# Patient Record
Sex: Male | Born: 1965 | Race: White | Hispanic: No | Marital: Married | Smoking: Current every day smoker
Health system: Southern US, Community
[De-identification: ages and names within clinical notes are randomized; demographics above are authoritative.]

## PROBLEM LIST (undated history)

## (undated) DIAGNOSIS — R51 Headache: Secondary | ICD-10-CM

## (undated) DIAGNOSIS — R519 Headache, unspecified: Secondary | ICD-10-CM

## (undated) DIAGNOSIS — E785 Hyperlipidemia, unspecified: Secondary | ICD-10-CM

## (undated) DIAGNOSIS — I10 Essential (primary) hypertension: Secondary | ICD-10-CM

## (undated) DIAGNOSIS — R569 Unspecified convulsions: Secondary | ICD-10-CM

## (undated) HISTORY — PX: POLYPECTOMY: SHX149

## (undated) HISTORY — DX: Hyperlipidemia, unspecified: E78.5

## (undated) HISTORY — DX: Unspecified convulsions: R56.9

## (undated) HISTORY — DX: Headache, unspecified: R51.9

## (undated) HISTORY — PX: COLONOSCOPY: SHX174

## (undated) HISTORY — DX: Essential (primary) hypertension: I10

## (undated) HISTORY — DX: Headache: R51

---

## 2010-07-12 HISTORY — PX: HEMORRHOID SURGERY: SHX153

## 2012-12-05 ENCOUNTER — Ambulatory Visit (INDEPENDENT_AMBULATORY_CARE_PROVIDER_SITE_OTHER): Payer: Commercial Indemnity | Admitting: Family Medicine

## 2012-12-05 ENCOUNTER — Telehealth: Payer: Self-pay | Admitting: Family Medicine

## 2012-12-05 ENCOUNTER — Encounter: Payer: Self-pay | Admitting: Family Medicine

## 2012-12-05 VITALS — BP 133/88 | HR 66 | Temp 98.0°F | Ht 69.5 in | Wt 195.0 lb

## 2012-12-05 DIAGNOSIS — K644 Residual hemorrhoidal skin tags: Secondary | ICD-10-CM

## 2012-12-05 MED ORDER — HYDROCORTISONE ACE-PRAMOXINE 1-1 % RE CREA: TOPICAL_CREAM | Freq: Two times a day (BID) | RECTAL | Status: DC

## 2012-12-05 NOTE — Progress Notes (Signed)
  Subjective:    Patient ID: Frederick Barr, male    DOB: 20-Apr-1966, 47 y.o.   MRN: [id number]  HPI Patient presents this morning with a very large right sided hemorrhoid that has been giving him trouble for about 4 days. He has had minimal pain and no bleeding. Most bothersome when he is on his feet and walking and getting up and getting down. He had previous problems in the past but that hemorrhoid resolved.  Review of Systems  Gastrointestinal: Positive for rectal pain (hemmorhoid). Negative for blood in stool and anal bleeding.       Objective:   Physical Exam  Constitutional: He is oriented to person, place, and time. He appears well-developed and well-nourished.  HENT:  Head: Normocephalic.  Eyes: Conjunctivae are normal. Right eye exhibits no discharge. Left eye exhibits no discharge.  Neck: Neck supple.  Abdominal: Soft. Bowel sounds are normal. He exhibits no distension and no mass. There is no tenderness. There is no rebound and no guarding.  No inguinal nodes. Rectal exam revealed a large protruding hemorrhoid on the right side. There was no bleeding. There was minimal pain to palpation  Musculoskeletal: Normal range of motion. He exhibits no edema.  Neurological: He is alert and oriented to person, place, and time.  Skin: Skin is warm and dry.  Psychiatric: He has a normal mood and affect. His behavior is normal. Judgment and thought content normal.          Assessment & Plan:   Large rectal hemorrhoid -ProctoCream HC 1 applicator full twice daily -Appointment with surgeon to look at I and D.     Patient Instructions  Warm tub soaks will help Continue stool softener Use medication as directed   depending on discomfort patient may remain out of work for the next 5-7 days. We will call  with appointment once it is available

## 2012-12-05 NOTE — Telephone Encounter (Signed)
PPT MADE

## 2012-12-05 NOTE — Patient Instructions (Addendum)
Warm tub soaks will help Continue stool softener Use medication as directed

## 2012-12-13 ENCOUNTER — Encounter (INDEPENDENT_AMBULATORY_CARE_PROVIDER_SITE_OTHER): Payer: Self-pay

## 2012-12-13 ENCOUNTER — Ambulatory Visit (INDEPENDENT_AMBULATORY_CARE_PROVIDER_SITE_OTHER): Payer: Managed Care, Other (non HMO) | Admitting: Surgery

## 2012-12-13 ENCOUNTER — Encounter (INDEPENDENT_AMBULATORY_CARE_PROVIDER_SITE_OTHER): Payer: Self-pay | Admitting: Surgery

## 2012-12-13 VITALS — BP 130/70 | HR 70 | Temp 98.0°F | Resp 18 | Ht 70.0 in | Wt 195.8 lb

## 2012-12-13 DIAGNOSIS — K645 Perianal venous thrombosis: Secondary | ICD-10-CM

## 2012-12-13 MED ORDER — HYDROCODONE-ACETAMINOPHEN 10-325 MG PO TABS: 1.0000 | ORAL_TABLET | Freq: Every day | ORAL | Status: AC

## 2012-12-13 NOTE — Progress Notes (Signed)
  Micharl Subramaniam 47 y.o.  Body mass index is 28.09 kg/(m^2).  There are no active problems to display for this patient.   No Known Allergies  History reviewed. No pertinent past surgical history. Rudi Heap, MD No diagnosis found.  Thrombosed hemorrhoids on the right side for several days.  Noted after mowing up hill.   After discussing with him and his wife, I prepped the area with chlorohexidine, injected with lido with epi and neut, and excised overlying skin on prolapsed internal hemorrhoids and evacuated a large number of clots.   Will give vicodin for pain.  Discussed how to avoid in the future.  Return 3 weeks Matt B. Daphine Deutscher, MD, [facility] [phone] beeper [phone]  12/13/2012 4:40 PM

## 2012-12-13 NOTE — Patient Instructions (Signed)
Hemorrhoids Hemorrhoids are swollen veins around the rectum or anus. There are two types of hemorrhoids:   Internal hemorrhoids. These occur in the veins just inside the rectum. They may poke through to the outside and become irritated and painful.  External hemorrhoids. These occur in the veins outside the anus and can be felt as a painful swelling or hard lump near the anus. CAUSES  Pregnancy.   Obesity.   Constipation or diarrhea.   Straining to have a bowel movement.   Sitting for long periods on the toilet.  Heavy lifting or other activity that caused you to strain.  Anal intercourse. SYMPTOMS   Pain.   Anal itching or irritation.   Rectal bleeding.   Fecal leakage.   Anal swelling.   One or more lumps around the anus.  DIAGNOSIS  Your caregiver may be able to diagnose hemorrhoids by visual examination. Other examinations or tests that may be performed include:   Examination of the rectal area with a gloved hand (digital rectal exam).   Examination of anal canal using a small tube (scope).   A blood test if you have lost a significant amount of blood.  A test to look inside the colon (sigmoidoscopy or colonoscopy). TREATMENT Most hemorrhoids can be treated at home. However, if symptoms do not seem to be getting better or if you have a lot of rectal bleeding, your caregiver may perform a procedure to help make the hemorrhoids get smaller or remove them completely. Possible treatments include:   Placing a rubber band at the base of the hemorrhoid to cut off the circulation (rubber band ligation).   Injecting a chemical to shrink the hemorrhoid (sclerotherapy).   Using a tool to burn the hemorrhoid (infrared light therapy).   Surgically removing the hemorrhoid (hemorrhoidectomy).   Stapling the hemorrhoid to block blood flow to the tissue (hemorrhoid stapling).  HOME CARE INSTRUCTIONS   Eat foods with fiber, such as whole grains, beans,  nuts, fruits, and vegetables. Ask your doctor about taking products with added fiber in them (fibersupplements).  Increase fluid intake. Drink enough water and fluids to keep your urine clear or pale yellow.   Exercise regularly.   Go to the bathroom when you have the urge to have a bowel movement. Do not wait.   Avoid straining to have bowel movements.   Keep the anal area dry and clean. Use wet toilet paper or moist towelettes after a bowel movement.   Medicated creams and suppositories may be used or applied as directed.   Only take over-the-counter or prescription medicines as directed by your caregiver.   Take warm sitz baths for 15 20 minutes, 3 4 times a day to ease pain and discomfort.   Place ice packs on the hemorrhoids if they are tender and swollen. Using ice packs between sitz baths may be helpful.   Put ice in a plastic bag.   Place a towel between your skin and the bag.   Leave the ice on for 15 20 minutes, 3 4 times a day.   Do not use a donut-shaped pillow or sit on the toilet for long periods. This increases blood pooling and pain.  SEEK MEDICAL CARE IF:  You have increasing pain and swelling that is not controlled by treatment or medicine.  You have uncontrolled bleeding.  You have difficulty or you are unable to have a bowel movement.  You have pain or inflammation outside the area of the hemorrhoids. MAKE SURE YOU:    Understand these instructions.  Will watch your condition.  Will get help right away if you are not doing well or get worse. Document Released: 06/25/2000 Document Revised: 06/14/2012 Document Reviewed: 05/02/2012 [employer] Patient Information 2014 [employer].  

## 2013-01-05 ENCOUNTER — Encounter (INDEPENDENT_AMBULATORY_CARE_PROVIDER_SITE_OTHER): Payer: Managed Care, Other (non HMO) | Admitting: Surgery

## 2013-02-23 ENCOUNTER — Encounter (INDEPENDENT_AMBULATORY_CARE_PROVIDER_SITE_OTHER): Payer: Managed Care, Other (non HMO) | Admitting: Surgery

## 2013-02-28 ENCOUNTER — Encounter (INDEPENDENT_AMBULATORY_CARE_PROVIDER_SITE_OTHER): Payer: Self-pay | Admitting: Surgery

## 2013-07-12 HISTORY — PX: COLONOSCOPY: SHX174

## 2013-09-11 ENCOUNTER — Encounter (INDEPENDENT_AMBULATORY_CARE_PROVIDER_SITE_OTHER): Payer: Self-pay

## 2013-09-11 ENCOUNTER — Ambulatory Visit (INDEPENDENT_AMBULATORY_CARE_PROVIDER_SITE_OTHER): Payer: Commercial Indemnity | Admitting: Family Medicine

## 2013-09-11 ENCOUNTER — Encounter: Payer: Self-pay | Admitting: Family Medicine

## 2013-09-11 VITALS — BP 149/105 | HR 76 | Temp 98.5°F | Ht 70.0 in | Wt 198.0 lb

## 2013-09-11 DIAGNOSIS — J069 Acute upper respiratory infection, unspecified: Secondary | ICD-10-CM

## 2013-09-11 DIAGNOSIS — I10 Essential (primary) hypertension: Secondary | ICD-10-CM | POA: Insufficient documentation

## 2013-09-11 DIAGNOSIS — J029 Acute pharyngitis, unspecified: Secondary | ICD-10-CM

## 2013-09-11 DIAGNOSIS — J40 Bronchitis, not specified as acute or chronic: Secondary | ICD-10-CM

## 2013-09-11 DIAGNOSIS — R6889 Other general symptoms and signs: Secondary | ICD-10-CM

## 2013-09-11 LAB — POCT INFLUENZA A/B
INFLUENZA A, POC: NEGATIVE
INFLUENZA B, POC: NEGATIVE

## 2013-09-11 LAB — POCT RAPID STREP A (OFFICE): Rapid Strep A Screen: NEGATIVE

## 2013-09-11 MED ORDER — AMLODIPINE BESY-BENAZEPRIL HCL 5-40 MG PO CAPS: 1.0000 | ORAL_CAPSULE | Freq: Every day | ORAL | Status: DC

## 2013-09-11 MED ORDER — AZITHROMYCIN 250 MG PO TABS: ORAL_TABLET | ORAL | Status: DC

## 2013-09-11 NOTE — Patient Instructions (Signed)
Take medication as directed Drink plenty of fluids Monitor blood pressure more regularly at home and bring readings for review in 4 weeks Take Mucinex maximum strength, blue and white in color, over-the-counter, one twice daily with a large glass of water Use saline nose spray Use a cool mist humidifier in her bedroom at nighttime Take Tylenol as needed for aches pains and fever

## 2013-09-11 NOTE — Progress Notes (Signed)
Subjective:    Patient ID: Frederick Barr, male    DOB: 07-09-1966, 48 y.o.   MRN: [id number]  HPI Patient here today for cough, congestion, and sore throat x 1 1/2 weeks.     There are no active problems to display for this patient.  Outpatient Encounter Prescriptions as of 09/11/2013  Medication Sig  . amLODipine-benazepril (LOTREL) 5-40 MG per capsule Take 1 capsule by mouth daily.  Marland Kitchen HYDROcodone-acetaminophen (NORCO) 10-325 MG per tablet Take 1 tablet by mouth at bedtime.  . pramoxine-hydrocortisone (PROCTOCREAM-HC) 1-1 % rectal cream Place rectally 2 (two) times daily.    Review of Systems  Constitutional: Positive for fatigue. Negative for fever and chills.  HENT: Positive for congestion (mostly in chest), postnasal drip, sinus pressure (mostly last week) and sore throat.   Eyes: Negative.   Respiratory: Positive for cough. Negative for shortness of breath.   Cardiovascular: Negative.   Gastrointestinal: Negative.   Endocrine: Negative.   Genitourinary: Negative.   Musculoskeletal: Negative.  Negative for myalgias.  Skin: Negative.   Allergic/Immunologic: Negative.   Neurological: Positive for headaches.  Hematological: Negative.   Psychiatric/Behavioral: Negative.        Objective:   Physical Exam  Nursing note and vitals reviewed. Constitutional: He is oriented to person, place, and time. He appears well-developed and well-nourished. No distress.  HENT:  Head: Normocephalic and atraumatic.  Right Ear: External ear normal.  Left Ear: External ear normal.  Nose: Nose normal.  Mouth/Throat: Oropharynx is clear and moist. No oropharyngeal exudate.  No sinus pressure tenderness  Eyes: Conjunctivae and EOM are normal. Pupils are equal, round, and reactive to light. Right eye exhibits no discharge. Left eye exhibits no discharge. No scleral icterus.  Neck: Normal range of motion. Neck supple. No tracheal deviation present. No thyromegaly present.  Cardiovascular:  Normal rate, regular rhythm, normal heart sounds and intact distal pulses.  Exam reveals no gallop and no friction rub.   No murmur heard. Irritative cough  Pulmonary/Chest: Effort normal and breath sounds normal. No respiratory distress. He has no wheezes. He has no rales. He exhibits no tenderness.  Abdominal: Soft. Bowel sounds are normal. He exhibits no mass. There is no tenderness. There is no rebound and no guarding.  Musculoskeletal: Normal range of motion.  Lymphadenopathy:    He has no cervical adenopathy.  Neurological: He is alert and oriented to person, place, and time. No cranial nerve deficit.  Skin: Skin is warm and dry. No rash noted.  Psychiatric: He has a normal mood and affect. His behavior is normal. Judgment and thought content normal.   BP 149/105  Pulse 76  Temp(Src) 98.5 F (36.9 C) (Oral)  Ht 5\' 10"  (1.778 m)  Wt 198 lb (89.812 kg)  BMI 28.41 kg/m2  Results for orders placed in visit on 09/11/13  POCT INFLUENZA A/B      Result Value Ref Range   Influenza A, POC Negative     Influenza B, POC Negative    POCT RAPID STREP A (OFFICE)      Result Value Ref Range   Rapid Strep A Screen Negative  Negative         Assessment & Plan:   1. Sore throat - POCT Influenza A/B - POCT rapid strep A  2. Flu-like symptoms - POCT Influenza A/B - POCT rapid strep A  3. Hypertension -Monitor blood pressures more regularly at home -Watch sodium intake -Bring readings for review in 4 week  4. URI (upper  respiratory infection)  5. Bronchitis  Meds ordered this encounter  Medications  . azithromycin (ZITHROMAX) 250 MG tablet    Sig: As directed    Dispense:  6 tablet    Refill:  0  . amLODipine-benazepril (LOTREL) 5-40 MG per capsule    Sig: Take 1 capsule by mouth daily.    Dispense:  30 capsule    Refill:  6   Patient Instructions  Take medication as directed Drink plenty of fluids Monitor blood pressure more regularly at home and bring readings for  review in 4 weeks Take Mucinex maximum strength, blue and white in color, over-the-counter, one twice daily with a large glass of water Use saline nose spray Use a cool mist humidifier in her bedroom at nighttime Take Tylenol as needed for aches pains and fever   Arrie Senate MD

## 2013-11-12 ENCOUNTER — Telehealth: Payer: Self-pay | Admitting: Family Medicine

## 2013-11-15 ENCOUNTER — Other Ambulatory Visit: Payer: Self-pay | Admitting: *Deleted

## 2013-11-15 MED ORDER — AMLODIPINE BESY-BENAZEPRIL HCL 5-40 MG PO CAPS: 1.0000 | ORAL_CAPSULE | Freq: Every day | ORAL | Status: DC

## 2013-11-16 ENCOUNTER — Other Ambulatory Visit: Payer: Self-pay

## 2013-11-16 MED ORDER — AMLODIPINE BESY-BENAZEPRIL HCL 5-40 MG PO CAPS: 1.0000 | ORAL_CAPSULE | Freq: Every day | ORAL | Status: DC

## 2014-02-21 ENCOUNTER — Other Ambulatory Visit: Payer: Self-pay | Admitting: Family Medicine

## 2014-02-22 NOTE — Telephone Encounter (Signed)
Last seen 3/15  DWM  Requesting 90 day supply

## 2014-03-06 ENCOUNTER — Encounter: Payer: Self-pay | Admitting: Internal Medicine

## 2014-03-06 ENCOUNTER — Ambulatory Visit (INDEPENDENT_AMBULATORY_CARE_PROVIDER_SITE_OTHER): Payer: Commercial Indemnity | Admitting: Family Medicine

## 2014-03-06 ENCOUNTER — Encounter: Payer: Self-pay | Admitting: Family Medicine

## 2014-03-06 ENCOUNTER — Ambulatory Visit (INDEPENDENT_AMBULATORY_CARE_PROVIDER_SITE_OTHER): Payer: Commercial Indemnity

## 2014-03-06 ENCOUNTER — Encounter (INDEPENDENT_AMBULATORY_CARE_PROVIDER_SITE_OTHER): Payer: Self-pay

## 2014-03-06 VITALS — BP 150/100 | HR 64 | Temp 98.7°F | Ht 70.0 in | Wt 200.0 lb

## 2014-03-06 DIAGNOSIS — I10 Essential (primary) hypertension: Secondary | ICD-10-CM

## 2014-03-06 DIAGNOSIS — N400 Enlarged prostate without lower urinary tract symptoms: Secondary | ICD-10-CM | POA: Insufficient documentation

## 2014-03-06 DIAGNOSIS — E785 Hyperlipidemia, unspecified: Secondary | ICD-10-CM | POA: Insufficient documentation

## 2014-03-06 DIAGNOSIS — Z8249 Family history of ischemic heart disease and other diseases of the circulatory system: Secondary | ICD-10-CM | POA: Insufficient documentation

## 2014-03-06 DIAGNOSIS — Z0000 Encounter for general adult medical examination without abnormal findings: Secondary | ICD-10-CM

## 2014-03-06 DIAGNOSIS — Z1211 Encounter for screening for malignant neoplasm of colon: Secondary | ICD-10-CM

## 2014-03-06 DIAGNOSIS — M67431 Ganglion, right wrist: Secondary | ICD-10-CM

## 2014-03-06 LAB — POCT URINALYSIS DIPSTICK
BILIRUBIN UA: NEGATIVE
Blood, UA: NEGATIVE
Glucose, UA: NEGATIVE
Ketones, UA: NEGATIVE
LEUKOCYTES UA: NEGATIVE
NITRITE UA: NEGATIVE
PH UA: 5
Spec Grav, UA: <=1.005
Urobilinogen, UA: NEGATIVE

## 2014-03-06 LAB — POCT CBC
Granulocyte percent: 60.9 %G (ref 37–80)
HCT, POC: 46.3 % (ref 43.5–53.7)
Hemoglobin: 14.6 g/dL (ref 14.1–18.1)
LYMPH, POC: 2 (ref 0.6–3.4)
MCH, POC: 26.5 pg — AB (ref 27–31.2)
MCHC: 31.5 g/dL — AB (ref 31.8–35.4)
MCV: 84.1 fL (ref 80–97)
MPV: 7.4 fL (ref 0–99.8)
PLATELET COUNT, POC: 250 10*3/uL (ref 142–424)
POC GRANULOCYTE: 3.5 (ref 2–6.9)
POC LYMPH %: 35 % (ref 10–50)
RBC: 5.5 M/uL (ref 4.69–6.13)
RDW, POC: 13.5 %
WBC: 5.7 10*3/uL (ref 4.6–10.2)

## 2014-03-06 LAB — POCT UA - MICROSCOPIC ONLY
Casts, Ur, LPF, POC: NEGATIVE
Crystals, Ur, HPF, POC: NEGATIVE
Epithelial cells, urine per micros: NEGATIVE
Mucus, UA: NEGATIVE
RBC, urine, microscopic: NEGATIVE
WBC, UR, HPF, POC: NEGATIVE
YEAST UA: NEGATIVE

## 2014-03-06 MED ORDER — AMLODIPINE BESY-BENAZEPRIL HCL 5-40 MG PO CAPS: ORAL_CAPSULE | ORAL | Status: DC

## 2014-03-06 NOTE — Progress Notes (Signed)
Subjective:    Patient ID: Frederick Barr, male    DOB: [birth year], 48 y.o.   MRN: [id number]  HPI Patient is here today for annual wellness exam and follow up of chronic medical problems. The patient indicates he has been out of blood pressure medications for one week. He complains of a cyst on his right wrist. His BMI is 28. We will arrange for a colonoscopy as he is 48 years old. We will also encourage him to get a stress test because of his family history of stroke and his personal history of hypertension. The patient indicates he has a very stressful job being a Librarian, academic. His family history was reviewed with him today and there is a family history of stroke and heart disease. The patient admits to smoking 2 or 3 cigarettes daily. He indicates he wants to stop. He was instructed to call the office any time he is about to run out of his blood pressure medications so that we can refill it for him.        Patient Active Problem List   Diagnosis Date Noted  . Hypertension 09/11/2013   Outpatient Encounter Prescriptions as of 03/06/2014  Medication Sig  . amLODipine-benazepril (LOTREL) 5-40 MG per capsule TAKE 1 CAPSULE BY MOUTH DAILY.  . [DISCONTINUED] azithromycin (ZITHROMAX) 250 MG tablet As directed  . [DISCONTINUED] pramoxine-hydrocortisone (PROCTOCREAM-HC) 1-1 % rectal cream Place rectally 2 (two) times daily.    Review of Systems  Constitutional: Negative.   HENT: Negative.   Eyes: Negative.   Respiratory: Negative.   Cardiovascular: Negative.   Gastrointestinal: Negative.   Endocrine: Negative.   Genitourinary: Negative.   Musculoskeletal: Negative.   Skin: Negative.        Cyst on right wrist  Allergic/Immunologic: Negative.   Neurological: Negative.   Hematological: Negative.   Psychiatric/Behavioral: Negative.        Objective:   Physical Exam  Nursing note and vitals reviewed. Constitutional: He is oriented to person, place, and time. He appears  well-developed and well-nourished. No distress.  Positive and alert  HENT:  Head: Normocephalic and atraumatic.  Right Ear: External ear normal.  Left Ear: External ear normal.  Nose: Nose normal.  Mouth/Throat: Oropharynx is clear and moist. No oropharyngeal exudate.  Eyes: Conjunctivae and EOM are normal. Pupils are equal, round, and reactive to light. Right eye exhibits no discharge. Left eye exhibits no discharge. No scleral icterus.  Neck: Normal range of motion. Neck supple. No thyromegaly present.  No thyromegaly or carotid bruits  Cardiovascular: Normal rate, regular rhythm, normal heart sounds and intact distal pulses.  Exam reveals no gallop and no friction rub.   No murmur heard. The heart has a regular rate and rhythm at 72 per min  Pulmonary/Chest: Effort normal and breath sounds normal. No respiratory distress. He has no wheezes. He has no rales. He exhibits no tenderness.  Axillary regions are negative for adenopathy  Abdominal: Soft. Bowel sounds are normal. He exhibits no mass. There is no tenderness. There is no rebound and no guarding.  No abdominal masses or tenderness. There are no inguinal nodes present.  Genitourinary: Rectum normal, prostate normal and penis normal.  The rectal exam had a slightly enlarged prostate with no lumps or masses. There were no rectal masses. There were no inguinal hernias or inguinal nodes palpable. The external genitalia were normal.  Musculoskeletal: Normal range of motion. He exhibits no edema and no tenderness.  Patient does have a ganglion cyst in the  right volar area of the wrist, the patient indicates this has become more painful and more sensitive to his physical activity.  Lymphadenopathy:    He has no cervical adenopathy.  Neurological: He is alert and oriented to person, place, and time. He has normal reflexes. No cranial nerve deficit.  Skin: Skin is warm and dry. No rash noted. No erythema. No pallor.  Psychiatric: He has a  normal mood and affect. His behavior is normal. Judgment and thought content normal.   BP 161/96  Pulse 64  Temp(Src) 98.7 F (37.1 C) (Oral)  Ht _0  (1.778 m)  Wt 200 lb (90.719 kg)  BMI 28.70 kg/m2  EKG: Sinus bradycardia otherwise normal EKG  [facility] reading (PRIMARY) by  Dr. Brunilda Payor x-ray-  no active disease                                       Assessment & Plan:  1. Essential hypertension - POCT CBC - BMP8+EGFR - Hepatic function panel - NMR, lipoprofile - EKG 12-Lead - DG Chest 2 View; Future - Ambulatory referral to Cardiology  2. Special screening for malignant neoplasms, colon - Ambulatory referral to Gastroenterology - POCT CBC  3. Annual physical exam - POCT CBC - POCT UA - Microscopic Only - POCT urinalysis dipstick - BMP8+EGFR - Hepatic function panel - NMR, lipoprofile - PSA, total and free - Vit D  25 hydroxy (rtn osteoporosis monitoring) - Thyroid Panel With TSH - EKG 12-Lead - DG Chest 2 View; Future  4. Hyperlipidemia  5. Family history of heart disease - Ambulatory referral to Cardiology  6. Ganglion cyst of wrist, right - Ambulatory referral to Orthopedic Surgery  7. BPH (benign prostatic hyperplasia)  Meds ordered this encounter  Medications  . amLODipine-benazepril (LOTREL) 5-40 MG per capsule    Sig: TAKE 1 CAPSULE BY MOUTH DAILY.    Dispense:  30 capsule    Refill:  1   Patient Instructions  Continue current medications. Continue good therapeutic lifestyle changes which include good diet and exercise. Fall precautions discussed with patient. If an FOBT was given today- please return it to our front desk. If you are over 99 years old - you may need Prevnar 30 or the adult Pneumonia vaccine.  Flu Shots will be available at our office starting mid- September. Please call and schedule a FLU CLINIC APPOINTMENT.   Keep record of BP- readings for 4 weeks (back on meds) and fax readings to nurse [phone]. We will arrange a  visit with GI- for colonoscopy, stress test, and ortho.  Check with insurance on cost for shingles shot (ZOSTAVAX)     Arrie Senate MD

## 2014-03-06 NOTE — Patient Instructions (Addendum)
Continue current medications. Continue good therapeutic lifestyle changes which include good diet and exercise. Fall precautions discussed with patient. If an FOBT was given today- please return it to our front desk. If you are over 50 years old - you may need Prevnar 5 or the adult Pneumonia vaccine.  Flu Shots will be available at our office starting mid- September. Please call and schedule a FLU CLINIC APPOINTMENT.   Keep record of BP- readings for 4 weeks (back on meds) and fax readings to nurse [phone]. We will arrange a visit with GI- for colonoscopy, stress test, and ortho.  Check with insurance on cost for shingles shot (ZOSTAVAX)

## 2014-03-07 ENCOUNTER — Telehealth: Payer: Self-pay

## 2014-03-07 LAB — NMR, LIPOPROFILE
Cholesterol: 209 mg/dL — ABNORMAL HIGH (ref 100–199)
HDL Cholesterol by NMR: 39 mg/dL — ABNORMAL LOW (ref >39)
HDL Particle Number: 30.4 umol/L — ABNORMAL LOW (ref >=30.5)
LDL Particle Number: 1710 nmol/L — ABNORMAL HIGH (ref <1000)
LDL Size: 20.2 nm (ref >20.5)
LDLC SERPL CALC-MCNC: 142 mg/dL — ABNORMAL HIGH (ref 0–99)
LP-IR Score: 56 — ABNORMAL HIGH (ref <=45)
Small LDL Particle Number: 1099 nmol/L — ABNORMAL HIGH (ref <=527)
Triglycerides by NMR: 138 mg/dL (ref 0–149)

## 2014-03-07 LAB — BMP8+EGFR
BUN/Creatinine Ratio: 9 (ref 9–20)
BUN: 9 mg/dL (ref 6–24)
CO2: 24 mmol/L (ref 18–29)
Calcium: 9.6 mg/dL (ref 8.7–10.2)
Chloride: 102 mmol/L (ref 97–108)
Creatinine, Ser: 1.01 mg/dL (ref 0.76–1.27)
GFR calc Af Amer: 100 mL/min/{1.73_m2} (ref >59)
GFR calc non Af Amer: 86 mL/min/{1.73_m2} (ref >59)
Glucose: 95 mg/dL (ref 65–99)
Potassium: 4.8 mmol/L (ref 3.5–5.2)
SODIUM: 143 mmol/L (ref 134–144)

## 2014-03-07 LAB — THYROID PANEL WITH TSH
Free Thyroxine Index: 2.6 (ref 1.2–4.9)
T3 Uptake Ratio: 28 % (ref 24–39)
T4 TOTAL: 9.2 ug/dL (ref 4.5–12.0)
TSH: 0.967 u[IU]/mL (ref 0.450–4.500)

## 2014-03-07 LAB — HEPATIC FUNCTION PANEL
ALT: 23 IU/L (ref 0–44)
AST: 21 IU/L (ref 0–40)
Albumin: 4.8 g/dL (ref 3.5–5.5)
Alkaline Phosphatase: 69 IU/L (ref 39–117)
Bilirubin, Direct: 0.1 mg/dL (ref 0.00–0.40)
Total Bilirubin: 0.5 mg/dL (ref 0.0–1.2)
Total Protein: 6.6 g/dL (ref 6.0–8.5)

## 2014-03-07 LAB — PSA, TOTAL AND FREE
PSA FREE: 0.29 ng/mL
PSA, Free Pct: 41.4 %
PSA: 0.7 ng/mL (ref 0.0–4.0)

## 2014-03-07 LAB — VITAMIN D 25 HYDROXY (VIT D DEFICIENCY, FRACTURES): VIT D 25 HYDROXY: 28.6 ng/mL — AB (ref 30.0–100.0)

## 2014-03-07 MED ORDER — VITAMIN D (ERGOCALCIFEROL) 1.25 MG (50000 UNIT) PO CAPS: 50000.0000 [IU] | ORAL_CAPSULE | ORAL | Status: DC

## 2014-03-07 NOTE — Telephone Encounter (Signed)
Wife aware of CXR results

## 2014-03-07 NOTE — Telephone Encounter (Signed)
Message copied by Koren Bound on Thu Mar 07, 2014 12:55 PM ------      Message from: Chipper Herb      Created: Wed Mar 06, 2014  1:07 PM       As per radiology report ------

## 2014-03-07 NOTE — Addendum Note (Signed)
Addended by: Zannie Cove on: 03/07/2014 09:14 AM   Modules accepted: Orders

## 2014-03-07 NOTE — Telephone Encounter (Signed)
Pt's wife aware of results

## 2014-03-12 ENCOUNTER — Telehealth: Payer: Self-pay

## 2014-03-12 ENCOUNTER — Other Ambulatory Visit: Payer: Self-pay | Admitting: *Deleted

## 2014-03-12 DIAGNOSIS — E785 Hyperlipidemia, unspecified: Secondary | ICD-10-CM

## 2014-03-12 DIAGNOSIS — I10 Essential (primary) hypertension: Secondary | ICD-10-CM

## 2014-03-12 DIAGNOSIS — Z8249 Family history of ischemic heart disease and other diseases of the circulatory system: Secondary | ICD-10-CM

## 2014-03-12 NOTE — Telephone Encounter (Signed)
You referred me to Dr Daryll Brod  I am not satisfied and want to be referred to someone more reputable

## 2014-03-12 NOTE — Telephone Encounter (Signed)
This is the physician that I have seen multiple times. I was seen anybody in my family to see him because I think he is very good. Please let me know you would like to see them we'll we'll be happy to make the referral to that  orthopedic surgeon

## 2014-03-13 ENCOUNTER — Telehealth: Payer: Self-pay | Admitting: Cardiology

## 2014-03-13 NOTE — Telephone Encounter (Signed)
Spoke with patient.

## 2014-03-13 NOTE — Telephone Encounter (Signed)
New message ° ° ° ° ° ° °Pt returning nurse call  °

## 2014-03-15 ENCOUNTER — Ambulatory Visit (INDEPENDENT_AMBULATORY_CARE_PROVIDER_SITE_OTHER): Payer: Managed Care, Other (non HMO) | Admitting: Physician Assistant

## 2014-03-15 DIAGNOSIS — E785 Hyperlipidemia, unspecified: Secondary | ICD-10-CM

## 2014-03-15 DIAGNOSIS — I10 Essential (primary) hypertension: Secondary | ICD-10-CM

## 2014-03-15 DIAGNOSIS — Z8249 Family history of ischemic heart disease and other diseases of the circulatory system: Secondary | ICD-10-CM

## 2014-03-15 NOTE — Progress Notes (Signed)
Exercise Treadmill Test  Frederick Barr is a 48 y.o. male smoker with a hx of HTN and FHx of CAD referred by PCP for ETT due to risk factors.  No hx of DM.  No hx of chest pain, dyspnea, syncope.  Exam unremarkable.  ECG: NSR no ST changes  Pre-Exercise Testing Evaluation Rhythm: normal sinus  Rate: 68     Test  Exercise Tolerance Test Ordering MD: Dola Argyle, MD  Interpreting MD: Richardson Dopp, PA-C  Unique Test No: 1  Treadmill:  1  Indication for ETT: Family History  Contraindication to ETT: No   Stress Modality: exercise - treadmill  Cardiac Imaging Performed: non   Protocol: standard Bruce - maximal  Max BP:  207/86  Max MPHR (bpm):  170 85% MPR (bpm):  145  MPHR obtained (bpm):  166 % MPHR obtained:  97  Reached 85% MPHR (min:sec):  8:48 Total Exercise Time (min-sec):  10:00  Workload in METS:  11.8 Borg Scale: 19  Reason ETT Terminated:  patient's desire to stop    ST Segment Analysis At Rest: normal ST segments - no evidence of significant ST depression With Exercise: non-specific ST changes  Other Information Arrhythmia:  No Angina during ETT:  absent (0) Quality of ETT:  diagnostic  ETT Interpretation:  normal - no evidence of ischemia by ST analysis  Comments: Good exercise capacity. No chest pain. Normal BP response to exercise. No significant ST changes to suggest ischemia.   Recommendations: FU with Redge Gainer, MD as directed. He has an appointment with Dr. Loralie Champagne as a new patient soon as well.  Signed,  Richardson Dopp, PA-C   03/15/2014 11:15 AM

## 2014-03-28 ENCOUNTER — Encounter: Payer: Self-pay | Admitting: *Deleted

## 2014-03-29 ENCOUNTER — Ambulatory Visit (INDEPENDENT_AMBULATORY_CARE_PROVIDER_SITE_OTHER): Payer: Managed Care, Other (non HMO) | Admitting: Cardiology

## 2014-03-29 ENCOUNTER — Encounter: Payer: Self-pay | Admitting: Cardiology

## 2014-03-29 VITALS — BP 168/108 | HR 73 | Ht 70.0 in | Wt 220.0 lb

## 2014-03-29 DIAGNOSIS — Z8249 Family history of ischemic heart disease and other diseases of the circulatory system: Secondary | ICD-10-CM

## 2014-03-29 DIAGNOSIS — I10 Essential (primary) hypertension: Secondary | ICD-10-CM

## 2014-03-29 DIAGNOSIS — E785 Hyperlipidemia, unspecified: Secondary | ICD-10-CM

## 2014-03-29 NOTE — Patient Instructions (Signed)
Your physician wants you to follow-up in: 1 year with Dr McLean. (September 2016).  You will receive a reminder letter in the mail two months in advance. If you don't receive a letter, please call our office to schedule the follow-up appointment.  

## 2014-03-31 NOTE — Progress Notes (Signed)
Patient ID: Frederick Barr, male   DOB: 1965/09/19, 48 y.o.   MRN: [id number] PCP: Dr. Laurance Flatten  48 yo with history of HTN, hyperlipidemia, and FH of CAD presents for cardiology evaluation.  He is an active smoker, 2-3 cigs/day.  His BP is very high today but he did not take his medication this morning and is also upset because he was "lost" in the waiting room by the front desk staff this morning.  He has good exercise tolerance.  No chest pain or exertional dyspnea.  Not a lot of formal exercise but walks a lot at work.  No tachypalpitations or lightheadedness.  Tolerates his BP med without problems. He says that BP generally runs 140s/80s after taking his medication.  He was here earlier this month for a stress test.  This was normal with 10 minutes exercise and no ischemic changes on ECG.  Also of note, LDL and LDL particle number were high on recent lipid panel.   Labs (8/15): K 4.8, creatinine 1.01, LDL particle number 1710, LDL 142  PMH: 1. HTN 2. Hyperlipidemia 3. Active smoker 4. ETT (9/15) with 10' exercise, no ischemic ECG changes.   SH: Teacher, English as a foreign language for [employer], lives in [place], smokes 2-3 cigs/day  FH: Mother with CABG in late 31s, mother with CVA, father with CVA  ROS: All systems reviewed and negative except as per HPI.   Current Outpatient Prescriptions  Medication Sig Dispense Refill  . amLODipine-benazepril (LOTREL) 5-40 MG per capsule TAKE 1 CAPSULE BY MOUTH DAILY.  30 capsule  1  . Vitamin D, Ergocalciferol, (DRISDOL) 50000 UNITS CAPS capsule Take 1 capsule (50,000 Units total) by mouth every 7 (seven) days.  12 capsule  1   No current facility-administered medications for this visit.   BP 168/108  Pulse 73  Ht 5\' 10"  (1.778 m)  Wt 220 lb (99.791 kg)  BMI 31.57 kg/m2  SpO2 98% General: NAD Neck: No JVD, no thyromegaly or thyroid nodule.  Lungs: Clear to auscultation bilaterally with normal respiratory effort. CV: Nondisplaced PMI.  Heart regular S1/S2, no  S3/S4, no murmur.  No peripheral edema.  No carotid bruit.  Normal pedal pulses.  Abdomen: Soft, nontender, no hepatosplenomegaly, no distention.  Skin: Intact without lesions or rashes.  Neurologic: Alert and oriented x 3.  Psych: Normal affect. Extremities: No clubbing or cyanosis.  HEENT: Normal.   Assessment/Plan: 1. CAD risk: Multiple risk factors including family history, smoking, HTN, hyperlipidemia.  It is imperative that he quit smoking.  We discussed this today.  He is also going to start on ASA 81 mg daily as he is now 48 yrs old. He had a reassuring treadmill stress test earlier this month and has no ischemic symptoms.  2. Hyperlipidemia: LDL is moderately elevated, but also significantly his LDL particle number is high.  This is a relatively high risk profile.  Given his overall risk profile, I think that he should begin on a statin. We discussed this today.  He has a followup visit to go over his lipids at Dr [facility] office and hopefully will initiate a statin at that time.  3. HTN: BP quite high today. He did not take his BP med this morning.  He says it runs 140s/80s when he is taking it.  Give most recent trial data, would like to see SBP < 130 if possible.  We talked about strategies for doing this.  He wants to work on losing 15-20 lbs, which he has done in  the past with fall in BP.  Would give him 6-8 months but if BP remains high at that time will need additional medication.   Loralie Champagne 03/31/2014

## 2014-04-26 ENCOUNTER — Ambulatory Visit (AMBULATORY_SURGERY_CENTER): Payer: Self-pay | Admitting: *Deleted

## 2014-04-26 VITALS — Ht 70.0 in | Wt 202.6 lb

## 2014-04-26 DIAGNOSIS — Z1211 Encounter for screening for malignant neoplasm of colon: Secondary | ICD-10-CM

## 2014-04-26 MED ORDER — MOVIPREP 100 G PO SOLR: 1.0000 | Freq: Once | ORAL | Status: DC

## 2014-04-26 NOTE — Progress Notes (Signed)
Denies allergies to eggs or soy products. Denies complications with sedation or anesthesia. Denies O2 use. Denies use of diet or weight loss medications.  Emmi instructions given for colonoscopy.  

## 2014-05-06 ENCOUNTER — Encounter: Payer: Self-pay | Admitting: Internal Medicine

## 2014-05-10 ENCOUNTER — Encounter: Payer: Self-pay | Admitting: Internal Medicine

## 2014-05-10 ENCOUNTER — Ambulatory Visit (AMBULATORY_SURGERY_CENTER): Payer: Managed Care, Other (non HMO) | Admitting: Internal Medicine

## 2014-05-10 VITALS — BP 123/88 | HR 60 | Temp 96.7°F | Resp 26 | Ht 70.0 in | Wt 202.0 lb

## 2014-05-10 DIAGNOSIS — D124 Benign neoplasm of descending colon: Secondary | ICD-10-CM

## 2014-05-10 DIAGNOSIS — D123 Benign neoplasm of transverse colon: Secondary | ICD-10-CM

## 2014-05-10 DIAGNOSIS — Z1211 Encounter for screening for malignant neoplasm of colon: Secondary | ICD-10-CM

## 2014-05-10 MED ORDER — SODIUM CHLORIDE 0.9 % IV SOLN: 500.0000 mL | INTRAVENOUS | Status: DC

## 2014-05-10 NOTE — Op Note (Signed)
[place]  [address] [place], [address]   COLONOSCOPY PROCEDURE REPORT  PATIENT: Frederick Barr, Frederick Barr  MR#: [id number] BIRTHDATE: 08-17-1965 , 48  yrs. old GENDER: male ENDOSCOPIST: Jerene Bears, MD REFERRED YT:[id number] Laurance Flatten, M.D. PROCEDURE DATE:  05/10/2014 PROCEDURE:   Colonoscopy with snare polypectomy and Colonoscopy with cold biopsy polypectomy First Screening Colonoscopy - Avg.  risk and is 50 yrs.  old or older Yes.  Prior Negative Screening - Now for repeat screening. N/A  History of Adenoma - Now for follow-up colonoscopy & has been > or = to 3 yrs.  N/A  Polyps Removed Today? Yes. ASA CLASS:   Class II INDICATIONS:average risk for colorectal cancer and first colonoscopy. MEDICATIONS: Monitored anesthesia care and Propofol 200 mg IV  DESCRIPTION OF PROCEDURE:   After the risks benefits and alternatives of the procedure were thoroughly explained, informed consent was obtained.  The digital rectal exam revealed no rectal mass.   The LB [id number]  endoscope was introduced through the anus and advanced to the cecum, which was identified by both the appendix and ileocecal valve. No adverse events experienced. The quality of the prep was good, using MoviPrep  The instrument was then slowly withdrawn as the colon was fully examined.   COLON FINDINGS: Five sessile polyps ranging between 3-66mm in size were found in the descending colon (1) and transverse colon (4). Polypectomies were performed with a cold snare (3) and with cold forceps (2).  The resection was complete, the polyp tissue was completely retrieved and sent to histology.   The examination was otherwise normal.  Retroflexed views revealed no abnormalities. The time to cecum=1 minutes 20 seconds.  Withdrawal time=9 minutes 58 seconds.  The scope was withdrawn and the procedure completed. COMPLICATIONS: There were no immediate complications.  ENDOSCOPIC IMPRESSION: 1.   Five sessile polyps  ranging between 3-56mm in size were found in the descending colon and transverse colon; polypectomies were performed with a cold snare and with cold forceps 2.   The examination was otherwise normal  RECOMMENDATIONS: 1.  Await pathology results 2.  If the polyps removed today are proven to be adenomatous (pre-cancerous) polyps, you will need a colonoscopy in 3 years. Otherwise you should continue to follow colorectal cancer screening guidelines for "routine risk" patients with a colonoscopy in 10 years.  You will receive a letter within 1-2 weeks with the results of your biopsy as well as final recommendations.  Please call my office if you have not received a letter after 3 weeks.  eSigned:  Jerene Bears, MD 05/10/2014 8:21 AM  cc: Redge Gainer, MD and The Patient

## 2014-05-10 NOTE — Progress Notes (Signed)
Patient awakening,vss,report to rn 

## 2014-05-10 NOTE — Progress Notes (Signed)
Called to room to assist during endoscopic procedure.  Patient ID and intended procedure confirmed with present staff. Received instructions for my participation in the procedure from the performing physician.  

## 2014-05-10 NOTE — Patient Instructions (Signed)
YOU HAD AN ENDOSCOPIC PROCEDURE TODAY AT THE [facility]: Refer to the procedure report that was given to you for any specific questions about what was found during the examination.  If the procedure report does not answer your questions, please call your gastroenterologist to clarify.  If you requested that your care partner not be given the details of your procedure findings, then the procedure report has been included in a sealed envelope for you to review at your convenience later.  YOU SHOULD EXPECT: Some feelings of bloating in the abdomen. Passage of more gas than usual.  Walking can help get rid of the air that was put into your GI tract during the procedure and reduce the bloating. If you had a lower endoscopy (such as a colonoscopy or flexible sigmoidoscopy) you may notice spotting of blood in your stool or on the toilet paper. If you underwent a bowel prep for your procedure, then you may not have a normal bowel movement for a few days.  DIET: Your first meal following the procedure should be a light meal and then it is ok to progress to your normal diet.  A half-sandwich or bowl of soup is an example of a good first meal.  Heavy or fried foods are harder to digest and may make you feel nauseous or bloated.  Likewise meals heavy in dairy and vegetables can cause extra gas to form and this can also increase the bloating.  Drink plenty of fluids but you should avoid alcoholic beverages for 24 hours.  ACTIVITY: Your care partner should take you home directly after the procedure.  You should plan to take it easy, moving slowly for the rest of the day.  You can resume normal activity the day after the procedure however you should NOT DRIVE or use heavy machinery for 24 hours (because of the sedation medicines used during the test).    SYMPTOMS TO REPORT IMMEDIATELY: A gastroenterologist can be reached at any hour.  During normal business hours, 8:30 AM to 5:00 PM Monday through Friday,  call [phone].  After hours and on weekends, please call the GI answering service at [phone] who will take a message and have the physician on call contact you.   Following lower endoscopy (colonoscopy or flexible sigmoidoscopy):  Excessive amounts of blood in the stool  Significant tenderness or worsening of abdominal pains  Swelling of the abdomen that is new, acute  Fever of 100F or higher    FOLLOW UP: If any biopsies were taken you will be contacted by phone or by letter within the next 1-3 weeks.  Call your gastroenterologist if you have not heard about the biopsies in 3 weeks.  Our staff will call the home number listed on your records the next business day following your procedure to check on you and address any questions or concerns that you may have at that time regarding the information given to you following your procedure. This is a courtesy call and so if there is no answer at the home number and we have not heard from you through the emergency physician on call, we will assume that you have returned to your regular daily activities without incident.  SIGNATURES/CONFIDENTIALITY: You and/or your care partner have signed paperwork which will be entered into your electronic medical record.  These signatures attest to the fact that that the information above on your After Visit Summary has been reviewed and is understood.  Full responsibility of the confidentiality   of this discharge information lies with you and/or your care-partner.    Information on polyps given to you today 

## 2014-05-13 ENCOUNTER — Telehealth: Payer: Self-pay

## 2014-05-13 NOTE — Telephone Encounter (Signed)
Left message on answering machine. 

## 2014-05-15 ENCOUNTER — Encounter: Payer: Self-pay | Admitting: Internal Medicine

## 2014-05-30 ENCOUNTER — Other Ambulatory Visit: Payer: Self-pay | Admitting: Family Medicine

## 2014-05-30 ENCOUNTER — Other Ambulatory Visit: Payer: Self-pay | Admitting: *Deleted

## 2014-05-30 DIAGNOSIS — I10 Essential (primary) hypertension: Secondary | ICD-10-CM

## 2014-05-30 MED ORDER — AMLODIPINE BESY-BENAZEPRIL HCL 5-40 MG PO CAPS
ORAL_CAPSULE | ORAL | Status: DC
Start: 2014-05-30 — End: 2014-05-31

## 2014-05-30 NOTE — Telephone Encounter (Signed)
Pt needs refill on Lotrel until his appt in Feb, lotrel rx sent to the [facility] in [place], pt aware will close call.

## 2014-05-31 ENCOUNTER — Other Ambulatory Visit: Payer: Self-pay | Admitting: *Deleted

## 2014-05-31 DIAGNOSIS — I10 Essential (primary) hypertension: Secondary | ICD-10-CM

## 2014-05-31 MED ORDER — AMLODIPINE BESY-BENAZEPRIL HCL 5-40 MG PO CAPS: ORAL_CAPSULE | ORAL | Status: DC

## 2014-09-06 ENCOUNTER — Ambulatory Visit (INDEPENDENT_AMBULATORY_CARE_PROVIDER_SITE_OTHER): Payer: Commercial Indemnity | Admitting: Family Medicine

## 2014-09-06 ENCOUNTER — Encounter: Payer: Self-pay | Admitting: Family Medicine

## 2014-09-06 VITALS — BP 132/89 | HR 67 | Temp 97.0°F | Ht 70.0 in | Wt 203.0 lb

## 2014-09-06 DIAGNOSIS — M25532 Pain in left wrist: Secondary | ICD-10-CM

## 2014-09-06 DIAGNOSIS — Z8249 Family history of ischemic heart disease and other diseases of the circulatory system: Secondary | ICD-10-CM

## 2014-09-06 DIAGNOSIS — E785 Hyperlipidemia, unspecified: Secondary | ICD-10-CM

## 2014-09-06 DIAGNOSIS — I10 Essential (primary) hypertension: Secondary | ICD-10-CM

## 2014-09-06 DIAGNOSIS — N400 Enlarged prostate without lower urinary tract symptoms: Secondary | ICD-10-CM

## 2014-09-06 DIAGNOSIS — E559 Vitamin D deficiency, unspecified: Secondary | ICD-10-CM

## 2014-09-06 MED ORDER — AMLODIPINE BESY-BENAZEPRIL HCL 5-40 MG PO CAPS: ORAL_CAPSULE | ORAL | Status: DC

## 2014-09-06 NOTE — Patient Instructions (Addendum)
Continue current medications. Continue good therapeutic lifestyle changes which include good diet and exercise. Fall precautions discussed with patient. If an FOBT was given today- please return it to our front desk. If you are over 51 years old - you may need Prevnar 87 or the adult Pneumonia vaccine.  Flu Shots are still available at our office. If you still haven't had one please call to set up a nurse visit to get one.   After your visit with [place] today you will receive a survey in the mail or online from [employer] regarding your care with [place]. Please take a moment to fill this out. Your feedback is very important to [place] as you can help [place] better understand your patient needs as well as improve your experience and satisfaction. WE CARE ABOUT YOU!!!   The patient should be more proactive on getting his cholesterol under control. Make sure that he returns for an advanced lipid panel with liver function test and he should get a BMP and a vitamin D level at that time also. When those results are returned and we will most likely start him on a statin drug and call in another prescription in for the vitamin D if it needs replacing with the lab work that comes back. Try taking some ibuprofen or Aleve on a regular basis for 7-10 days to remove any inflammation from the wrist as well as use some warm wet compresses and avoid heavy lifting pushing pulling or straining. If the wrist pain continues please return to the office and we will x-ray the wrist. We may also want to consider getting a uric acid to make sure that there is no gout involved.

## 2014-09-06 NOTE — Progress Notes (Signed)
Subjective:    Patient ID: Frederick Barr, male    DOB: August 04, 1965, 49 y.o.   MRN: [id number]  HPI Pt here for follow up and management of chronic medical problems which includes hypertension and hyperlipidemia. He is taking medication regularly. In following up on previous lab work and requests, the patient did not see the clinical pharmacist to discuss therapeutic lifestyle changes or to initiate any statin drug. He also does not recall [place] requesting him to take vitamin D 50,000 units once weekly. We will today reemphasized to him the importance of getting his cholesterol under control and to taking his vitamin D. Since it has been 6 months, we will ask him to come back and get his lab work repeated and we will initiate treatment after seeing that. The patient also brings in blood pressures in for review and the majority of these are good. We will ask him to collect some more readings prior to his lab work that will be done fasting and bring those readings in with the lab work at that time. These readings will be scanned into his record.         Patient Active Problem List   Diagnosis Date Noted  . Hyperlipidemia 03/06/2014  . BPH (benign prostatic hyperplasia) 03/06/2014  . Family history of heart disease 03/06/2014  . Hypertension 09/11/2013   Outpatient Encounter Prescriptions as of 09/06/2014  Medication Sig  . amLODipine-benazepril (LOTREL) 5-40 MG per capsule TAKE 1 CAPSULE BY MOUTH DAILY.  . [DISCONTINUED] Vitamin D, Ergocalciferol, (DRISDOL) 50000 UNITS CAPS capsule Take 1 capsule (50,000 Units total) by mouth every 7 (seven) days.    Review of Systems  Constitutional: Negative.   HENT: Negative.   Eyes: Negative.   Respiratory: Negative.   Cardiovascular: Negative.   Gastrointestinal: Negative.   Endocrine: Negative.   Genitourinary: Negative.   Musculoskeletal: Positive for arthralgias (left wrist pain at times).  Skin: Negative.   Allergic/Immunologic: Negative.     Neurological: Negative.   Hematological: Negative.   Psychiatric/Behavioral: Negative.        Objective:   Physical Exam  Constitutional: He is oriented to person, place, and time. He appears well-developed and well-nourished. No distress.  HENT:  Head: Normocephalic and atraumatic.  Right Ear: External ear normal.  Left Ear: External ear normal.  Nose: Nose normal.  Mouth/Throat: Oropharynx is clear and moist. No oropharyngeal exudate.  Eyes: Conjunctivae and EOM are normal. Pupils are equal, round, and reactive to light. Right eye exhibits no discharge. Left eye exhibits no discharge. No scleral icterus.  Neck: Normal range of motion. Neck supple. No thyromegaly present.  No carotid bruits or anterior cervical adenopathy  Cardiovascular: Normal rate, regular rhythm and normal heart sounds.   No murmur heard. Pulmonary/Chest: Effort normal and breath sounds normal. No respiratory distress. He has no wheezes. He has no rales. He exhibits no tenderness.  Abdominal: Soft. Bowel sounds are normal. He exhibits no mass. There is no tenderness. There is no rebound and no guarding.  Musculoskeletal: Normal range of motion. He exhibits no edema or tenderness.  Good left wrist movement with no tenderness swelling or rubor  Lymphadenopathy:    He has no cervical adenopathy.  Neurological: He is alert and oriented to person, place, and time. He has normal reflexes. No cranial nerve deficit.  Skin: Skin is warm and dry. No rash noted. No erythema. No pallor.  Psychiatric: He has a normal mood and affect. His behavior is normal. Judgment and thought content  normal.  Nursing note and vitals reviewed.  BP 132/89 mmHg  Pulse 67  Temp(Src) 97 F (36.1 C) (Oral)  Ht _0  (1.778 m)  Wt 203 lb (92.08 kg)  BMI 29.13 kg/m2        Assessment & Plan:  1. Essential hypertension -For now, continue current treatment. Bring readings in for review when fasting lab work is done - POCT CBC;  Future - BMP8+EGFR; Future - NMR, lipoprofile; Future - Hepatic function panel; Future - amLODipine-benazepril (LOTREL) 5-40 MG per capsule; TAKE 1 CAPSULE BY MOUTH DAILY.  Dispense: 90 capsule; Refill: 0  2. Hyperlipidemia -After rechecking the cholesterol with a fasting lab work we will consider trying or starting Crestor to get the cholesterol under better control. - POCT CBC; Future - NMR, lipoprofile; Future  3. BPH (benign prostatic hyperplasia) -No complaints with this today passing his water without problems - POCT CBC; Future  4. Family history of heart disease -We have reemphasized to the patient the importance of getting his cholesterol under better control. - POCT CBC; Future - BMP8+EGFR; Future - NMR, lipoprofile; Future - Hepatic function panel; Future  5. Vitamin D deficiency -We will recheck this and we'll wait until lab work is returned to start vitamin D 50,000 units once weekly  6. Left wrist pain -Take ibuprofen or Aleve twice daily for 7-10 days after breakfast and supper and if problems continue to occur return to office and we will x-ray the wrist and check a uric acid  Meds ordered this encounter  Medications  . amLODipine-benazepril (LOTREL) 5-40 MG per capsule    Sig: TAKE 1 CAPSULE BY MOUTH DAILY.    Dispense:  90 capsule    Refill:  0   Patient Instructions  Continue current medications. Continue good therapeutic lifestyle changes which include good diet and exercise. Fall precautions discussed with patient. If an FOBT was given today- please return it to our front desk. If you are over 87 years old - you may need Prevnar 36 or the adult Pneumonia vaccine.  Flu Shots are still available at our office. If you still haven't had one please call to set up a nurse visit to get one.   After your visit with [place] today you will receive a survey in the mail or online from [employer] regarding your care with [place]. Please take a moment to fill this out. Your  feedback is very important to [place] as you can help [place] better understand your patient needs as well as improve your experience and satisfaction. WE CARE ABOUT YOU!!!   The patient should be more proactive on getting his cholesterol under control. Make sure that he returns for an advanced lipid panel with liver function test and he should get a BMP and a vitamin D level at that time also. When those results are returned and we will most likely start him on a statin drug and call in another prescription in for the vitamin D if it needs replacing with the lab work that comes back. Try taking some ibuprofen or Aleve on a regular basis for 7-10 days to remove any inflammation from the wrist as well as use some warm wet compresses and avoid heavy lifting pushing pulling or straining. If the wrist pain continues please return to the office and we will x-ray the wrist. We may also want to consider getting a uric acid to make sure that there is no gout involved.   Arrie Senate MD

## 2014-11-22 ENCOUNTER — Other Ambulatory Visit (INDEPENDENT_AMBULATORY_CARE_PROVIDER_SITE_OTHER): Payer: Commercial Indemnity

## 2014-11-22 ENCOUNTER — Other Ambulatory Visit: Payer: Self-pay | Admitting: *Deleted

## 2014-11-22 DIAGNOSIS — I10 Essential (primary) hypertension: Secondary | ICD-10-CM

## 2014-11-22 DIAGNOSIS — R5383 Other fatigue: Secondary | ICD-10-CM

## 2014-11-22 DIAGNOSIS — E785 Hyperlipidemia, unspecified: Secondary | ICD-10-CM | POA: Diagnosis not present

## 2014-11-22 LAB — POCT CBC
Granulocyte percent: 64.7 %G (ref 37–80)
HCT, POC: 47.5 % (ref 43.5–53.7)
Hemoglobin: 14.2 g/dL (ref 14.1–18.1)
Lymph, poc: 1.9 (ref 0.6–3.4)
MCH, POC: 24.9 pg — AB (ref 27–31.2)
MCHC: 29.8 g/dL — AB (ref 31.8–35.4)
MCV: 83.4 fL (ref 80–97)
MPV: 7.8 fL (ref 0–99.8)
PLATELET COUNT, POC: 260 10*3/uL (ref 142–424)
POC Granulocyte: 3.9 (ref 2–6.9)
POC LYMPH PERCENT: 31.7 %L (ref 10–50)
RBC: 5.69 M/uL (ref 4.69–6.13)
RDW, POC: 13.8 %
WBC: 6 10*3/uL (ref 4.6–10.2)

## 2014-11-22 MED ORDER — AMLODIPINE BESY-BENAZEPRIL HCL 5-40 MG PO CAPS
ORAL_CAPSULE | ORAL | Status: DC
Start: 2014-11-22 — End: 2016-12-31

## 2014-11-22 NOTE — Progress Notes (Signed)
Lab only 

## 2014-11-23 LAB — NMR, LIPOPROFILE
Cholesterol: 213 mg/dL — ABNORMAL HIGH (ref 100–199)
HDL Cholesterol by NMR: 37 mg/dL — ABNORMAL LOW (ref >39)
HDL Particle Number: 30.7 umol/L (ref >=30.5)
LDL Particle Number: 1767 nmol/L — ABNORMAL HIGH (ref <1000)
LDL Size: 20.1 nm (ref >20.5)
LDL-C: 140 mg/dL — AB (ref 0–99)
LP-IR SCORE: 66 — AB (ref <=45)
Small LDL Particle Number: 1111 nmol/L — ABNORMAL HIGH (ref <=527)
Triglycerides by NMR: 178 mg/dL — ABNORMAL HIGH (ref 0–149)

## 2014-11-23 LAB — HEPATIC FUNCTION PANEL
ALT: 20 IU/L (ref 0–44)
AST: 15 IU/L (ref 0–40)
Albumin: 4.4 g/dL (ref 3.5–5.5)
Alkaline Phosphatase: 69 IU/L (ref 39–117)
BILIRUBIN TOTAL: 0.4 mg/dL (ref 0.0–1.2)
Bilirubin, Direct: 0.07 mg/dL (ref 0.00–0.40)
Total Protein: 6.3 g/dL (ref 6.0–8.5)

## 2014-11-23 LAB — BMP8+EGFR
BUN / CREAT RATIO: 13 (ref 9–20)
BUN: 12 mg/dL (ref 6–24)
CALCIUM: 9.4 mg/dL (ref 8.7–10.2)
CO2: 25 mmol/L (ref 18–29)
Chloride: 102 mmol/L (ref 97–108)
Creatinine, Ser: 0.92 mg/dL (ref 0.76–1.27)
GFR, EST AFRICAN AMERICAN: 112 mL/min/{1.73_m2} (ref >59)
GFR, EST NON AFRICAN AMERICAN: 97 mL/min/{1.73_m2} (ref >59)
Glucose: 88 mg/dL (ref 65–99)
Potassium: 4.7 mmol/L (ref 3.5–5.2)
SODIUM: 140 mmol/L (ref 134–144)

## 2014-11-25 ENCOUNTER — Telehealth: Payer: Self-pay | Admitting: *Deleted

## 2014-11-25 MED ORDER — ROSUVASTATIN CALCIUM 10 MG PO TABS: 10.0000 mg | ORAL_TABLET | Freq: Every day | ORAL | Status: DC

## 2014-11-25 NOTE — Telephone Encounter (Signed)
-----   Message from Chipper Herb, MD sent at 11/24/2014 10:15 AM EDT ----- The blood sugar is good at 88. The creatinine, the most important kidney function test mange within normal limits. The electrolytes including potassium are good. Cholesterol numbers with advanced lipid testing remain elevated at 1767. Previously there were 1710. The LDL C is elevated at 140 and this number should be less than 100. The triglycerides are elevated at 178 and previously they were 138. All liver function tests are within normal limits Because of the persistent elevated LDL particle number the patient should consider starting a statin like Crestor 10 mg #90 one daily. Please give him a coupon for a 3 month supply for $3. He should have his liver function tests rechecked in 4 weeks after starting the Crestor,----also continue to encourage aggressive therapeutic lifestyle changes which include diet and exercise and offer him an appointment with the clinical pharmacist to discuss this if he so desires

## 2015-03-21 ENCOUNTER — Ambulatory Visit: Payer: Commercial Indemnity | Admitting: Family Medicine

## 2015-04-23 ENCOUNTER — Encounter: Payer: Commercial Indemnity | Admitting: Family Medicine

## 2015-08-08 ENCOUNTER — Ambulatory Visit: Payer: Managed Care, Other (non HMO) | Admitting: Cardiology

## 2015-10-29 ENCOUNTER — Ambulatory Visit: Payer: Managed Care, Other (non HMO) | Admitting: Cardiology

## 2016-02-02 ENCOUNTER — Ambulatory Visit: Payer: Managed Care, Other (non HMO) | Admitting: Cardiology

## 2016-04-14 ENCOUNTER — Ambulatory Visit: Payer: Managed Care, Other (non HMO) | Admitting: Cardiology

## 2016-08-11 ENCOUNTER — Telehealth: Payer: Self-pay | Admitting: Physician Assistant

## 2016-08-11 MED ORDER — OSELTAMIVIR PHOSPHATE 75 MG PO CAPS: 75.0000 mg | ORAL_CAPSULE | Freq: Every day | ORAL | 0 refills | Status: AC

## 2016-08-11 NOTE — Telephone Encounter (Signed)
Prescription sent to pharmacy.

## 2016-12-14 ENCOUNTER — Ambulatory Visit: Payer: Commercial Indemnity | Admitting: Family Medicine

## 2016-12-31 ENCOUNTER — Ambulatory Visit (INDEPENDENT_AMBULATORY_CARE_PROVIDER_SITE_OTHER): Payer: Managed Care, Other (non HMO) | Admitting: Physician Assistant

## 2016-12-31 ENCOUNTER — Encounter: Payer: Self-pay | Admitting: Physician Assistant

## 2016-12-31 ENCOUNTER — Other Ambulatory Visit: Payer: Self-pay | Admitting: *Deleted

## 2016-12-31 ENCOUNTER — Telehealth: Payer: Self-pay | Admitting: *Deleted

## 2016-12-31 VITALS — BP 185/123 | HR 66 | Temp 97.4°F | Ht 70.0 in | Wt 206.6 lb

## 2016-12-31 DIAGNOSIS — I10 Essential (primary) hypertension: Secondary | ICD-10-CM

## 2016-12-31 MED ORDER — AMLODIPINE BESY-BENAZEPRIL HCL 2.5-10 MG PO CAPS: 1.0000 | ORAL_CAPSULE | Freq: Every day | ORAL | 0 refills | Status: DC

## 2016-12-31 MED ORDER — AMLODIPINE BESY-BENAZEPRIL HCL 5-20 MG PO CAPS: ORAL_CAPSULE | ORAL | 2 refills | Status: DC

## 2016-12-31 NOTE — Telephone Encounter (Signed)
[facility] of generic Lotrel 2.5/10 mg sent Rx there.

## 2016-12-31 NOTE — Progress Notes (Signed)
Subjective:     Patient ID: Frederick Barr, male   DOB: 1966/06/13, 51 y.o.   MRN: [id number]  HPI Pt with ah hx of HTN requiring medical management Pt stopped his med ~ 1.5 yrs ago and has not followed up since that time Over the lat short period of time he has notice an increase in HA and just not feeling right Over the last week he has been taking his BP at home and noted sig elevations Here for further review  Review of Systems  Constitutional: Negative.   HENT: Negative.   Respiratory: Negative.   Cardiovascular: Negative.   Neurological: Positive for headaches. Negative for dizziness, syncope, weakness, light-headedness and numbness.       Objective:   Physical Exam  Constitutional: He appears well-developed and well-nourished.  HENT:  Mouth/Throat: Oropharyngeal exudate present.  Neck: Neck supple. No JVD present.  No bruits  Cardiovascular: Normal rate, regular rhythm, normal heart sounds and intact distal pulses.   No murmur heard. Pulmonary/Chest: Effort normal and breath sounds normal.  Musculoskeletal: He exhibits no edema.  Lymphadenopathy:    He has no cervical adenopathy.  Nursing note and vitals reviewed. CMP pending     Assessment:     1. Essential hypertension        Plan:     Review prev notes that Lotrel 5/40 regulated his BP well Will titrate him back Lotrel 5/10 1 po qd x 1 week and then increase to 2qd x 1 week F/U with regular provider in 2 weeks Will inform of lab results F/U sooner if any problems

## 2016-12-31 NOTE — Patient Instructions (Signed)

## 2017-01-01 LAB — CMP14+EGFR
ALBUMIN: 4.5 g/dL (ref 3.5–5.5)
ALT: 25 IU/L (ref 0–44)
AST: 22 IU/L (ref 0–40)
Albumin/Globulin Ratio: 2.1 (ref 1.2–2.2)
Alkaline Phosphatase: 76 IU/L (ref 39–117)
BUN/Creatinine Ratio: 11 (ref 9–20)
BUN: 11 mg/dL (ref 6–24)
Bilirubin Total: 0.3 mg/dL (ref 0.0–1.2)
CO2: 24 mmol/L (ref 20–29)
Calcium: 9.7 mg/dL (ref 8.7–10.2)
Chloride: 103 mmol/L (ref 96–106)
Creatinine, Ser: 0.97 mg/dL (ref 0.76–1.27)
GFR, EST AFRICAN AMERICAN: 103 mL/min/{1.73_m2} (ref >59)
GFR, EST NON AFRICAN AMERICAN: 89 mL/min/{1.73_m2} (ref >59)
GLOBULIN, TOTAL: 2.1 g/dL (ref 1.5–4.5)
Glucose: 87 mg/dL (ref 65–99)
Potassium: 4.6 mmol/L (ref 3.5–5.2)
Sodium: 143 mmol/L (ref 134–144)
Total Protein: 6.6 g/dL (ref 6.0–8.5)

## 2017-01-25 ENCOUNTER — Encounter: Payer: Self-pay | Admitting: Family Medicine

## 2017-01-25 ENCOUNTER — Ambulatory Visit (INDEPENDENT_AMBULATORY_CARE_PROVIDER_SITE_OTHER): Payer: Managed Care, Other (non HMO) | Admitting: Family Medicine

## 2017-01-25 VITALS — BP 158/100 | HR 71 | Temp 97.2°F | Ht 70.0 in | Wt 204.0 lb

## 2017-01-25 DIAGNOSIS — R059 Cough, unspecified: Secondary | ICD-10-CM

## 2017-01-25 DIAGNOSIS — I10 Essential (primary) hypertension: Secondary | ICD-10-CM

## 2017-01-25 DIAGNOSIS — R05 Cough: Secondary | ICD-10-CM | POA: Diagnosis not present

## 2017-01-25 MED ORDER — AZITHROMYCIN 250 MG PO TABS: ORAL_TABLET | ORAL | 0 refills | Status: DC

## 2017-01-25 MED ORDER — AMLODIPINE BESY-BENAZEPRIL HCL 5-20 MG PO CAPS: 1.0000 | ORAL_CAPSULE | Freq: Every day | ORAL | 1 refills | Status: DC

## 2017-01-25 NOTE — Patient Instructions (Addendum)
Continue same dose BP meds - Lotrel 5/20 one a day We sent in a [place]  Keep appointment for your physical on 02/18/17. Take plain Mucinex one twice daily with a large glass of water and avoid irritating environments as much as possible

## 2017-01-25 NOTE — Progress Notes (Signed)
Subjective:    Patient ID: Frederick Barr, male    DOB: 1966/05/10, 51 y.o.   MRN: [id number]  HPI Patient here today for 1 month follow up on HTN. He states he feels better and BP is running better as well. The patient is currently taking Lotrel 520. His blood pressure readings are getting close to being better. He brings in readings from the outside. He has had a cough and cold for 2 weeks and was taking Mucinex and DayQuil. Some the readings from home are at goal. The patient denies any chest pain or shortness of breath. He has had a lingering cough and is coughing up some yellow sputum and this just seems to not go away. He said his son was sick about the time that he picked up his cough and he also did some deck sustaining on a very hot day at about the time all of this started. He denies any chest pain or shortness of breath other than lingering cough. He brings blood pressures in from the outside and some of the blood pressures are definitely improved especially since he is taking a total of 520 on the Lotrel. We will write him a new prescription for the full strength of 520 and have him continue to take this. We will encourage him to watch his salt intake and try to lose some weight. He has had some lab work done and this will be reviewed with him during the visit. His blood sugar was good at 87 and the creatinine was good at 0.97 electrolytes including potassium are good in all liver function tests were normal. No problems with passing his water or no problems with his bowel movements.   Patient Active Problem List   Diagnosis Date Noted  . Vitamin D deficiency 09/06/2014  . Hyperlipidemia 03/06/2014  . BPH (benign prostatic hyperplasia) 03/06/2014  . Family history of heart disease 03/06/2014  . Hypertension 09/11/2013   Outpatient Encounter Prescriptions as of 01/25/2017  Medication Sig  . amlodipine-benazepril (LOTREL) 2.5-10 MG capsule Take 1 capsule by mouth daily. 1 po qd x 1 week and  then increase to 2qd x 1 week   No facility-administered encounter medications on file as of 01/25/2017.       Review of Systems  Constitutional: Negative.   HENT: Positive for congestion.   Eyes: Negative.   Respiratory: Positive for cough (x 2 weeks).   Cardiovascular: Negative.   Gastrointestinal: Negative.   Endocrine: Negative.   Genitourinary: Negative.   Musculoskeletal: Negative.   Skin: Negative.   Allergic/Immunologic: Negative.   Neurological: Negative.   Hematological: Negative.   Psychiatric/Behavioral: Negative.        Objective:   Physical Exam  Constitutional: He is oriented to person, place, and time. He appears well-developed and well-nourished. No distress.  Pleasant and alert  HENT:  Head: Normocephalic and atraumatic.  Right Ear: External ear normal.  Left Ear: External ear normal.  Mouth/Throat: Oropharynx is clear and moist. No oropharyngeal exudate.  Slight nasal congestion and turbinate swelling left greater than right  Eyes: Pupils are equal, round, and reactive to light. Conjunctivae and EOM are normal. Right eye exhibits no discharge. Left eye exhibits no discharge. No scleral icterus.  Neck: Normal range of motion. Neck supple. No thyromegaly present.  No bruits thyromegaly or anterior cervical adenopathy  Cardiovascular: Normal rate, regular rhythm and normal heart sounds.   No murmur heard. Pulmonary/Chest: Effort normal and breath sounds normal. No respiratory distress. He has  no wheezes. He has no rales.  Dry cough with no rales or rhonchi  Abdominal: Soft. Bowel sounds are normal. He exhibits no mass. There is no tenderness. There is no rebound and no guarding.  No abdominal tenderness or organ enlargement or bruits  Musculoskeletal: Normal range of motion. He exhibits no edema or tenderness.  Lymphadenopathy:    He has no cervical adenopathy.  Neurological: He is alert and oriented to person, place, and time.  Skin: Skin is warm and  dry. No rash noted.  Psychiatric: He has a normal mood and affect. His behavior is normal. Judgment and thought content normal.  Nursing note and vitals reviewed.  BP (!) 143/92 (BP Location: Left Arm, Patient Position: Sitting, Cuff Size: Large)   Pulse 71   Temp (!) 97.2 F (36.2 C) (Oral)   Ht 5\' 10"  (1.778 m)   Wt 204 lb (92.5 kg)   BMI 29.27 kg/m    Repeat blood pressure 158/100 in the right arm sitting with a large cuff     Assessment & Plan:  1. Essential hypertension -Continue Lotrel 520  2. Cough -Continue with regular and plain Mucinex, plenty of fluids and take Z-Pak as directed  Meds ordered this encounter  Medications  . amLODipine-benazepril (LOTREL) 5-20 MG capsule    Sig: Take 1 capsule by mouth daily.    Dispense:  90 capsule    Refill:  1  . azithromycin (ZITHROMAX Z-PAK) 250 MG tablet    Sig: As directed    Dispense:  1 each    Refill:  0   Patient Instructions  Continue same dose BP meds - Lotrel 5/20 one a day We sent in a [place]  Keep appointment for your physical on 02/18/17. Take plain Mucinex one twice daily with a large glass of water and avoid irritating environments as much as possible  Arrie Senate MD

## 2017-02-16 ENCOUNTER — Other Ambulatory Visit: Payer: Managed Care, Other (non HMO)

## 2017-02-16 DIAGNOSIS — Z0000 Encounter for general adult medical examination without abnormal findings: Secondary | ICD-10-CM

## 2017-02-16 DIAGNOSIS — I10 Essential (primary) hypertension: Secondary | ICD-10-CM

## 2017-02-16 DIAGNOSIS — N400 Enlarged prostate without lower urinary tract symptoms: Secondary | ICD-10-CM

## 2017-02-16 DIAGNOSIS — E7800 Pure hypercholesterolemia, unspecified: Secondary | ICD-10-CM

## 2017-02-17 LAB — BMP8+EGFR
BUN / CREAT RATIO: 10 (ref 9–20)
BUN: 10 mg/dL (ref 6–24)
CO2: 25 mmol/L (ref 20–29)
CREATININE: 1 mg/dL (ref 0.76–1.27)
Calcium: 9.4 mg/dL (ref 8.7–10.2)
Chloride: 103 mmol/L (ref 96–106)
GFR calc Af Amer: 99 mL/min/{1.73_m2} (ref >59)
GFR, EST NON AFRICAN AMERICAN: 86 mL/min/{1.73_m2} (ref >59)
Glucose: 88 mg/dL (ref 65–99)
POTASSIUM: 4.4 mmol/L (ref 3.5–5.2)
Sodium: 141 mmol/L (ref 134–144)

## 2017-02-17 LAB — HEPATIC FUNCTION PANEL
ALT: 25 IU/L (ref 0–44)
AST: 20 IU/L (ref 0–40)
Albumin: 4.5 g/dL (ref 3.5–5.5)
Alkaline Phosphatase: 70 IU/L (ref 39–117)
BILIRUBIN TOTAL: 0.4 mg/dL (ref 0.0–1.2)
Bilirubin, Direct: 0.11 mg/dL (ref 0.00–0.40)
Total Protein: 6.3 g/dL (ref 6.0–8.5)

## 2017-02-17 LAB — CBC WITH DIFFERENTIAL/PLATELET
BASOS: 0 %
Basophils Absolute: 0 10*3/uL (ref 0.0–0.2)
EOS (ABSOLUTE): 0.2 10*3/uL (ref 0.0–0.4)
EOS: 2 %
HEMATOCRIT: 44.7 % (ref 37.5–51.0)
Hemoglobin: 15 g/dL (ref 13.0–17.7)
IMMATURE GRANULOCYTES: 0 %
Immature Grans (Abs): 0 10*3/uL (ref 0.0–0.1)
Lymphocytes Absolute: 2.6 10*3/uL (ref 0.7–3.1)
Lymphs: 38 %
MCH: 27.9 pg (ref 26.6–33.0)
MCHC: 33.6 g/dL (ref 31.5–35.7)
MCV: 83 fL (ref 79–97)
MONOS ABS: 0.5 10*3/uL (ref 0.1–0.9)
Monocytes: 7 %
NEUTROS ABS: 3.6 10*3/uL (ref 1.4–7.0)
NEUTROS PCT: 53 %
PLATELETS: 265 10*3/uL (ref 150–379)
RBC: 5.37 x10E6/uL (ref 4.14–5.80)
RDW: 14.6 % (ref 12.3–15.4)
WBC: 6.9 10*3/uL (ref 3.4–10.8)

## 2017-02-17 LAB — PSA, TOTAL AND FREE
PSA FREE: 0.27 ng/mL
PSA, Free Pct: 45 %
Prostate Specific Ag, Serum: 0.6 ng/mL (ref 0.0–4.0)

## 2017-02-17 LAB — THYROID PANEL WITH TSH
Free Thyroxine Index: 2.3 (ref 1.2–4.9)
T3 Uptake Ratio: 26 % (ref 24–39)
T4 TOTAL: 8.9 ug/dL (ref 4.5–12.0)
TSH: 1.73 u[IU]/mL (ref 0.450–4.500)

## 2017-02-17 LAB — NMR, LIPOPROFILE
Cholesterol: 216 mg/dL — ABNORMAL HIGH (ref 100–199)
HDL Cholesterol by NMR: 34 mg/dL — ABNORMAL LOW (ref >39)
HDL Particle Number: 28.6 umol/L — ABNORMAL LOW (ref >=30.5)
LDL Particle Number: 1851 nmol/L — ABNORMAL HIGH (ref <1000)
LDL SIZE: 20.2 nm (ref >20.5)
LDL-C: 140 mg/dL — ABNORMAL HIGH (ref 0–99)
LP-IR Score: 79 — ABNORMAL HIGH (ref <=45)
SMALL LDL PARTICLE NUMBER: 1167 nmol/L — AB (ref <=527)
TRIGLYCERIDES BY NMR: 211 mg/dL — AB (ref 0–149)

## 2017-02-17 LAB — VITAMIN D 25 HYDROXY (VIT D DEFICIENCY, FRACTURES): VIT D 25 HYDROXY: 25.2 ng/mL — AB (ref 30.0–100.0)

## 2017-02-18 ENCOUNTER — Other Ambulatory Visit: Payer: Self-pay | Admitting: Family Medicine

## 2017-02-18 ENCOUNTER — Ambulatory Visit (INDEPENDENT_AMBULATORY_CARE_PROVIDER_SITE_OTHER): Payer: Managed Care, Other (non HMO) | Admitting: Family Medicine

## 2017-02-18 ENCOUNTER — Ambulatory Visit (INDEPENDENT_AMBULATORY_CARE_PROVIDER_SITE_OTHER): Payer: Managed Care, Other (non HMO)

## 2017-02-18 ENCOUNTER — Encounter: Payer: Self-pay | Admitting: Family Medicine

## 2017-02-18 VITALS — BP 142/82 | HR 71 | Temp 97.3°F | Ht 70.0 in | Wt 205.0 lb

## 2017-02-18 DIAGNOSIS — E559 Vitamin D deficiency, unspecified: Secondary | ICD-10-CM

## 2017-02-18 DIAGNOSIS — Z23 Encounter for immunization: Secondary | ICD-10-CM | POA: Diagnosis not present

## 2017-02-18 DIAGNOSIS — H6122 Impacted cerumen, left ear: Secondary | ICD-10-CM

## 2017-02-18 DIAGNOSIS — I10 Essential (primary) hypertension: Secondary | ICD-10-CM

## 2017-02-18 DIAGNOSIS — N400 Enlarged prostate without lower urinary tract symptoms: Secondary | ICD-10-CM

## 2017-02-18 DIAGNOSIS — Z0000 Encounter for general adult medical examination without abnormal findings: Secondary | ICD-10-CM

## 2017-02-18 DIAGNOSIS — E7800 Pure hypercholesterolemia, unspecified: Secondary | ICD-10-CM

## 2017-02-18 LAB — MICROSCOPIC EXAMINATION
Bacteria, UA: NONE SEEN
EPITHELIAL CELLS (NON RENAL): NONE SEEN /HPF (ref 0–10)
RBC, UA: NONE SEEN /hpf (ref 0–?)
RENAL EPITHEL UA: NONE SEEN /HPF
WBC UA: NONE SEEN /HPF (ref 0–?)

## 2017-02-18 LAB — URINALYSIS, COMPLETE
BILIRUBIN UA: NEGATIVE
Glucose, UA: NEGATIVE
Ketones, UA: NEGATIVE
LEUKOCYTES UA: NEGATIVE
NITRITE UA: NEGATIVE
PH UA: 5.5 (ref 5.0–7.5)
Protein, UA: NEGATIVE
RBC UA: NEGATIVE
Specific Gravity, UA: 1.015 (ref 1.005–1.030)
Urobilinogen, Ur: 0.2 mg/dL (ref 0.2–1.0)

## 2017-02-18 MED ORDER — VITAMIN D (ERGOCALCIFEROL) 1.25 MG (50000 UNIT) PO CAPS: 50000.0000 [IU] | ORAL_CAPSULE | ORAL | 3 refills | Status: DC

## 2017-02-18 MED ORDER — ROSUVASTATIN CALCIUM 10 MG PO TABS: 10.0000 mg | ORAL_TABLET | Freq: Every day | ORAL | 3 refills | Status: DC

## 2017-02-18 MED ORDER — AMLODIPINE BESY-BENAZEPRIL HCL 5-40 MG PO CAPS: 1.0000 | ORAL_CAPSULE | Freq: Every day | ORAL | 3 refills | Status: DC

## 2017-02-18 NOTE — Progress Notes (Signed)
Subjective:    Patient ID: Holland Falling, male    DOB: [birth year], 51 y.o.   MRN: [id number]  HPI Patient is here today for annual wellness exam and follow up of chronic medical problems which includes hypertension. He is taking medication regularly.The patient is doing well overall. His blood pressure in the office today was good but some of the home readings are elevated. They will be scanned into the record. The home readings are running 130 - 150 over the low 90s. He is currently taking Lotrel 5-20. He is had blood work done and this will be reviewed with him during the visit today. His PSA was low and within normal limits. The blood sugar was good at 88 and the creatinine and electrolytes were all good and within normal limits. The CBC had a normal white blood cell count with a good hemoglobin at 15 and an adequate platelet count. All liver function tests were normal. The vitamin D level was low and the patient will need to start taking vitamin D 50,000 units 1 weekly #12 with refills. All thyroid tests were normal. Cholesterol numbers with advanced lipid testing have a total LDL particle number that was elevated at 1851 and this is higher than it has been over the past few years. The LDL C was elevated at 140 and triglycerides were elevated at 211. The good cholesterol the HDL particle number remains low. At 28.6. The patient denies any chest pain or shortness of breath. He denies any trouble with swallowing heartburn indigestion nausea vomiting diarrhea or blood in the stool. He's passing his water without problems. He does indicate that he had polyps on a recent colonoscopy and was told that he needed a repeat colonoscopy which will be coming up by the end of October of November 1. We will just remind him that if he doesn't hear from the gastroenterologist to call us and we will call them.      Patient Active Problem List   Diagnosis Date Noted  . Vitamin D deficiency 09/06/2014  .  Hyperlipidemia 03/06/2014  . BPH (benign prostatic hyperplasia) 03/06/2014  . Family history of heart disease 03/06/2014  . Hypertension 09/11/2013   Outpatient Encounter Prescriptions as of 02/18/2017  Medication Sig  . amLODipine-benazepril (LOTREL) 5-20 MG capsule Take 1 capsule by mouth daily.  . [DISCONTINUED] azithromycin (ZITHROMAX Z-PAK) 250 MG tablet As directed   No facility-administered encounter medications on file as of 02/18/2017.      Review of Systems  Constitutional: Negative.   HENT: Negative.   Eyes: Negative.   Respiratory: Negative.   Cardiovascular: Negative.   Gastrointestinal: Negative.   Endocrine: Negative.   Genitourinary: Negative.   Musculoskeletal: Negative.   Skin: Negative.   Allergic/Immunologic: Negative.   Neurological: Negative.   Hematological: Negative.   Psychiatric/Behavioral: Negative.        Objective:   Physical Exam  Constitutional: He is oriented to person, place, and time. He appears well-developed and well-nourished. No distress.  The patient is pleasant and relaxed. He understands with his family history that it is important that he get his risk factors reduce because of the family history of heart disease.  HENT:  Head: Normocephalic and atraumatic.  Right Ear: External ear normal.  Mouth/Throat: Oropharynx is clear and moist. No oropharyngeal exudate.  Some turbinate congestion bilaterally Ear cerumen left ear canal  Eyes: Pupils are equal, round, and reactive to light. Conjunctivae and EOM are normal. Right eye exhibits no discharge. Left eye  exhibits no discharge. No scleral icterus.  Recent eye exam this year  Neck: Normal range of motion. Neck supple. No thyromegaly present.  No bruits thyromegaly or anterior cervical adenopathy  Cardiovascular: Normal rate, regular rhythm, normal heart sounds and intact distal pulses.   No murmur heard. Heart is regular at 72/m  Pulmonary/Chest: Effort normal and breath sounds  normal. No respiratory distress. He has no wheezes. He has no rales. He exhibits no tenderness.  Clear anteriorly and posteriorly without axillary adenopathy  Abdominal: Soft. Bowel sounds are normal. He exhibits no mass. There is no tenderness. There is no rebound and no guarding.  No abdominal tenderness masses or organ enlargement or bruits or inguinal adenopathy  Genitourinary: Rectum normal and penis normal.  Genitourinary Comments: The prostate was slightly enlarged but soft and smooth. There were no rectal masses. External genitalia were within normal limits. There is no inguinal adenopathy.  Musculoskeletal: Normal range of motion. He exhibits no edema.  Lymphadenopathy:    He has no cervical adenopathy.  Neurological: He is alert and oriented to person, place, and time. He has normal reflexes. No cranial nerve deficit.  Skin: Skin is warm and dry. No rash noted.  Psychiatric: He has a normal mood and affect. His behavior is normal. Judgment and thought content normal.  Nursing note and vitals reviewed.  BP 115/79 (BP Location: Left Arm)   Pulse 71   Temp (!) 97.3 F (36.3 C) (Oral)   Ht 5\' 10"  (1.778 m)   Wt 205 lb (93 kg)   BMI 29.41 kg/m   Air irrigation left ear canal for cerumen EKG within normal limits     Assessment & Plan:  1. Annual physical exam -The patient is due to get a colonoscopy this fall. Dr. Evlyn Clines is the gastroenterologist who should be calling you sometime to arrange this to happen. He should call sometime in September or October. If he doesn't you should call us. - Urinalysis, Complete - EKG 12-Lead - DG Chest 2 View; Future  2. Essential hypertension The blood pressure is elevated today on 2 occasions in his numbers from home were also elevated and we will ask him to increase his Lotrel from 5/20 to 5/40 -The patient should come by in a couple weeks and get a repeat BMP and have the nurse check his blood pressure and review the readings from  home at that time - EKG 12-Lead - DG Chest 2 View; Future  3. Pure hypercholesterolemia -Cholesterol numbers with advanced lipid testing remain elevated and with the family history of heart disease the patient understands the importance of getting these numbers down. -Start Crestor 10 mg 1 daily -Follow more aggressive therapeutic lifestyle changes - Hepatic function panel; Future  4. Vitamin D deficiency -Start vitamin D 50,000 units 1 weekly and stay on this until we tell you to reduce the dose.  5. Benign prostatic hyperplasia without lower urinary tract symptoms -The prostate is slightly enlarged and the patient has a normal PSA. We will continue to monitor this on a yearly basis. He has no urinary tract symptoms associated with the enlargement.  6. Excessive cerumen in left ear canal -This was irrigated. Successfully. Meds ordered this encounter  Medications  . rosuvastatin (CRESTOR) 10 MG tablet    Sig: Take 1 tablet (10 mg total) by mouth daily.    Dispense:  90 tablet    Refill:  3   Patient Instructions  Continue current medications. Continue good therapeutic lifestyle changes  which include good diet and exercise. Fall precautions discussed with patient. If an FOBT was given today- please return it to our front desk. If you are over 42 years old - you may need Prevnar 62 or the adult Pneumonia vaccine.  **Flu shots are available--- please call and schedule a FLU-CLINIC appointment**  After your visit with [place] today you will receive a survey in the mail or online from [employer] regarding your care with [place]. Please take a moment to fill this out. Your feedback is very important to [place] as you can help [place] better understand your patient needs as well as improve your experience and satisfaction. WE CARE ABOUT YOU!!!   Because of the elevated cholesterol and her family history of heart disease you'll need to try to do better with your diet including exercise walking and watching  foods that are fried and high in cholesterol and triglycerides. We're asking to start Crestor 10 mg 1 daily at bedtime and he should come back in 4-6 weeks and have repeat liver function tests. In 3-4 months we will see you back and repeat cholesterol numbers. We must get the cholesterol down as it is atherogenic and causes blockages and increases the risk for heart disease and circulation problems. We would ask that you watch her sodium intake try to make every effort at losing weight and continue to monitor blood pressures at home. Also your vitamin D level was low and you need to start vitamin D 50,000 units once weekly. We would ask you to continue to take this and refill it as directed and when we see you back in the future we'll check another vitamin D level. We will increase the amlodipine-Benzapril from 5-20 to 5-40. We will ask that you monitor the blood pressures more closely over the next couple weeks and bring readings by for review and get a BMP in 2 weeks Eat less salt    Arrie Senate MD

## 2017-02-18 NOTE — Patient Instructions (Addendum)
Continue current medications. Continue good therapeutic lifestyle changes which include good diet and exercise. Fall precautions discussed with patient. If an FOBT was given today- please return it to our front desk. If you are over 53 years old - you may need Prevnar 65 or the adult Pneumonia vaccine.  **Flu shots are available--- please call and schedule a FLU-CLINIC appointment**  After your visit with [place] today you will receive a survey in the mail or online from [employer] regarding your care with [place]. Please take a moment to fill this out. Your feedback is very important to [place] as you can help [place] better understand your patient needs as well as improve your experience and satisfaction. WE CARE ABOUT YOU!!!   Because of the elevated cholesterol and her family history of heart disease you'll need to try to do better with your diet including exercise walking and watching foods that are fried and high in cholesterol and triglycerides. We're asking to start Crestor 10 mg 1 daily at bedtime and he should come back in 4-6 weeks and have repeat liver function tests. In 3-4 months we will see you back and repeat cholesterol numbers. We must get the cholesterol down as it is atherogenic and causes blockages and increases the risk for heart disease and circulation problems. We would ask that you watch her sodium intake try to make every effort at losing weight and continue to monitor blood pressures at home. Also your vitamin D level was low and you need to start vitamin D 50,000 units once weekly. We would ask you to continue to take this and refill it as directed and when we see you back in the future we'll check another vitamin D level. We will increase the amlodipine-Benzapril from 5-20 to 5-40. We will ask that you monitor the blood pressures more closely over the next couple weeks and bring readings by for review and get a BMP in 2 weeks Eat less salt

## 2017-03-01 ENCOUNTER — Telehealth: Payer: Self-pay | Admitting: Family Medicine

## 2017-03-01 NOTE — Telephone Encounter (Signed)
Pt wife aware - he will come Friday for BP ck

## 2017-03-04 ENCOUNTER — Ambulatory Visit (INDEPENDENT_AMBULATORY_CARE_PROVIDER_SITE_OTHER): Payer: Managed Care, Other (non HMO) | Admitting: *Deleted

## 2017-03-04 VITALS — BP 126/82 | HR 77

## 2017-03-04 DIAGNOSIS — Z0130 Encounter for examination of blood pressure without abnormal findings: Secondary | ICD-10-CM

## 2017-03-04 NOTE — Progress Notes (Signed)
Pt here for BP check BP 126 82 P 77

## 2017-03-04 NOTE — Progress Notes (Signed)
Good blood pressure reading and the patient should continue with current treatment regularly

## 2017-05-18 ENCOUNTER — Encounter: Payer: Self-pay | Admitting: Internal Medicine

## 2017-06-29 ENCOUNTER — Ambulatory Visit (INDEPENDENT_AMBULATORY_CARE_PROVIDER_SITE_OTHER): Payer: Managed Care, Other (non HMO) | Admitting: Family Medicine

## 2017-06-29 ENCOUNTER — Encounter: Payer: Self-pay | Admitting: Family Medicine

## 2017-06-29 VITALS — BP 140/96 | HR 71 | Temp 97.7°F | Ht 70.0 in | Wt 203.0 lb

## 2017-06-29 DIAGNOSIS — E7800 Pure hypercholesterolemia, unspecified: Secondary | ICD-10-CM | POA: Diagnosis not present

## 2017-06-29 DIAGNOSIS — I10 Essential (primary) hypertension: Secondary | ICD-10-CM | POA: Diagnosis not present

## 2017-06-29 DIAGNOSIS — E559 Vitamin D deficiency, unspecified: Secondary | ICD-10-CM

## 2017-06-29 MED ORDER — HYDROCHLOROTHIAZIDE 25 MG PO TABS: 25.0000 mg | ORAL_TABLET | Freq: Every day | ORAL | 1 refills | Status: DC

## 2017-06-29 NOTE — Patient Instructions (Addendum)
Continue current medications. Continue good therapeutic lifestyle changes which include good diet and exercise. Fall precautions discussed with patient. If an FOBT was given today- please return it to our front desk. If you are over 53 years old - you may need Prevnar 76 or the adult Pneumonia vaccine.  **Flu shots are available--- please call and schedule a FLU-CLINIC appointment**  After your visit with [place] today you will receive a survey in the mail or online from [employer] regarding your care with [place]. Please take a moment to fill this out. Your feedback is very important to [place] as you can help [place] better understand your patient needs as well as improve your experience and satisfaction. WE CARE ABOUT YOU!!!   Continue to watch sodium intake Check blood pressures regularly, bring readings and monitor to the next visit We will add HCTZ 12.5 mg to the current treatment regimen of calcium channel blocker and ACE inhibitor.

## 2017-06-29 NOTE — Progress Notes (Signed)
Subjective:    Patient ID: Frederick Barr, male    DOB: 01-18-1966, 51 y.o.   MRN: [id number]  HPI Pt here for follow up and management of chronic medical problems which includes hyperlipidemia and hypertension. She is taking medication regularly.  This patient has a long-term history of hypertension hyperlipidemia.  He also has vitamin D deficiency.  He has been taking amlodipine/benazepril 5-40 once daily.  Is also taking Crestor 10 and vitamin D 50,000 units weekly.  The initial blood pressure was good at 134/81.  He brings in blood pressure readings from the outside and these readings seem to be running with the last one yesterday being 145/89.  The patient's weight is 203 pounds down a couple pounds since his last visit here.  The patient denies any chest pain or shortness of breath.  His headaches are less but he still has headaches and he has had headaches all of his wife.  He has had high blood pressure all of his life even since he was in middle school.  He has not taken care of it as regularly as he has been recently and he is trying to do better with that.  He denies any trouble with his intestinal tract including nausea vomiting diarrhea blood in the stool and is passing his water well.  He has an [employer] blood pressure monitor.  When he comes back for his next visit around the end of January we will ask him to bring the monitor back so we can compare it with our monitor.      Patient Active Problem List   Diagnosis Date Noted  . Vitamin D deficiency 09/06/2014  . Hyperlipidemia 03/06/2014  . Benign prostatic hyperplasia without lower urinary tract symptoms 03/06/2014  . Family history of heart disease 03/06/2014  . Hypertension 09/11/2013   Outpatient Encounter Medications as of 06/29/2017  Medication Sig  . amLODipine-benazepril (LOTREL) 5-40 MG capsule Take 1 capsule by mouth daily.  . rosuvastatin (CRESTOR) 10 MG tablet Take 1 tablet (10 mg total) by mouth daily.  . Vitamin D,  Ergocalciferol, (DRISDOL) 50000 units CAPS capsule Take 1 capsule (50,000 Units total) by mouth every 7 (seven) days.   No facility-administered encounter medications on file as of 06/29/2017.      Review of Systems  Constitutional: Negative.   HENT: Negative.   Eyes: Negative.   Respiratory: Negative.   Cardiovascular: Negative.   Gastrointestinal: Negative.   Endocrine: Negative.   Genitourinary: Negative.   Musculoskeletal: Negative.   Skin: Negative.   Allergic/Immunologic: Negative.   Neurological: Negative.   Hematological: Negative.   Psychiatric/Behavioral: Negative.        Objective:   Physical Exam  Constitutional: He is oriented to person, place, and time. He appears well-developed and well-nourished. No distress.  HENT:  Head: Normocephalic and atraumatic.  Eyes: Conjunctivae and EOM are normal. Pupils are equal, round, and reactive to light. Right eye exhibits no discharge. Left eye exhibits no discharge. No scleral icterus.  Neck: Normal range of motion. Neck supple. No thyromegaly present.  Cardiovascular: Normal rate, regular rhythm, normal heart sounds and intact distal pulses.  No murmur heard. Heart is regular at 72/min  Pulmonary/Chest: Effort normal and breath sounds normal. No respiratory distress. He has no wheezes. He has no rales.  Abdominal: Soft. Bowel sounds are normal. He exhibits no mass. There is no tenderness. There is no rebound and no guarding.  Musculoskeletal: Normal range of motion. He exhibits no edema.  Lymphadenopathy:  He has no cervical adenopathy.  Neurological: He is alert and oriented to person, place, and time.  Skin: Skin is warm and dry. No rash noted.  Psychiatric: He has a normal mood and affect. His behavior is normal. Judgment and thought content normal.  Nursing note and vitals reviewed.   BP 134/81 (BP Location: Left Arm)   Pulse 71   Temp 97.7 F (36.5 C) (Oral)   Ht 5\' 10"  (1.778 m)   Wt 203 lb (92.1 kg)   BMI  29.13 kg/m   Repeat blood pressure by me was 140/96 with a large cuff in the right arm sitting     Assessment & Plan:  1. Essential hypertension -Blood pressure is still not quite at goal but improved and patient is feeling better with less headaches but still has headaches and no dizziness or visual issues.  2. Pure hypercholesterolemia -Lab work is pending patient still on Crestor  3. Vitamin D deficiency -Vitamin D level pending patient still on vitamin D 50,000 units weekly.  Meds ordered this encounter  Medications  . hydrochlorothiazide (HYDRODIURIL) 25 MG tablet    Sig: Take 1 tablet (25 mg total) by mouth daily. As directed    Dispense:  90 tablet    Refill:  1   Patient Instructions  Continue current medications. Continue good therapeutic lifestyle changes which include good diet and exercise. Fall precautions discussed with patient. If an FOBT was given today- please return it to our front desk. If you are over 10 years old - you may need Prevnar 92 or the adult Pneumonia vaccine.  **Flu shots are available--- please call and schedule a FLU-CLINIC appointment**  After your visit with [place] today you will receive a survey in the mail or online from [employer] regarding your care with [place]. Please take a moment to fill this out. Your feedback is very important to [place] as you can help [place] better understand your patient needs as well as improve your experience and satisfaction. WE CARE ABOUT YOU!!!   Continue to watch sodium intake Check blood pressures regularly, bring readings and monitor to the next visit We will add HCTZ 12.5 mg to the current treatment regimen of calcium channel blocker and ACE inhibitor.   Arrie Senate MD

## 2017-07-29 ENCOUNTER — Encounter: Payer: Self-pay | Admitting: Internal Medicine

## 2017-08-05 ENCOUNTER — Ambulatory Visit: Payer: Managed Care, Other (non HMO) | Admitting: Family Medicine

## 2017-09-23 ENCOUNTER — Ambulatory Visit (AMBULATORY_SURGERY_CENTER): Payer: Self-pay

## 2017-09-23 VITALS — Ht 70.0 in

## 2017-09-23 DIAGNOSIS — Z86010 Personal history of colonic polyps: Secondary | ICD-10-CM

## 2017-09-23 MED ORDER — NA SULFATE-K SULFATE-MG SULF 17.5-3.13-1.6 GM/177ML PO SOLN: 1.0000 | Freq: Once | ORAL | 0 refills | Status: AC

## 2017-09-23 NOTE — Progress Notes (Signed)
Per pt, no allergies to soy or egg products.Pt not taking any weight loss meds or using  O2 at home.  Pt refused emmi video. 

## 2017-09-26 ENCOUNTER — Encounter: Payer: Self-pay | Admitting: Internal Medicine

## 2017-10-06 ENCOUNTER — Ambulatory Visit (AMBULATORY_SURGERY_CENTER): Payer: Managed Care, Other (non HMO) | Admitting: Internal Medicine

## 2017-10-06 ENCOUNTER — Other Ambulatory Visit: Payer: Self-pay

## 2017-10-06 ENCOUNTER — Encounter: Payer: Self-pay | Admitting: Internal Medicine

## 2017-10-06 VITALS — BP 112/81 | HR 57 | Temp 98.0°F | Resp 12 | Ht 70.0 in | Wt 203.0 lb

## 2017-10-06 DIAGNOSIS — D125 Benign neoplasm of sigmoid colon: Secondary | ICD-10-CM | POA: Diagnosis not present

## 2017-10-06 DIAGNOSIS — D122 Benign neoplasm of ascending colon: Secondary | ICD-10-CM

## 2017-10-06 DIAGNOSIS — Z86010 Personal history of colonic polyps: Secondary | ICD-10-CM

## 2017-10-06 DIAGNOSIS — D123 Benign neoplasm of transverse colon: Secondary | ICD-10-CM | POA: Diagnosis not present

## 2017-10-06 DIAGNOSIS — D124 Benign neoplasm of descending colon: Secondary | ICD-10-CM

## 2017-10-06 HISTORY — PX: COLONOSCOPY: SHX174

## 2017-10-06 MED ORDER — SODIUM CHLORIDE 0.9 % IV SOLN: 500.0000 mL | Freq: Once | INTRAVENOUS | Status: DC

## 2017-10-06 NOTE — Progress Notes (Signed)
Report to RN, VSS, adequate respirations noted, no c/o pain or discomfort 

## 2017-10-06 NOTE — Patient Instructions (Signed)
Information on polyps given.   YOU HAD AN ENDOSCOPIC PROCEDURE TODAY AT THE [facility]:   Refer to the procedure report that was given to you for any specific questions about what was found during the examination.  If the procedure report does not answer your questions, please call your gastroenterologist to clarify.  If you requested that your care partner not be given the details of your procedure findings, then the procedure report has been included in a sealed envelope for you to review at your convenience later.  YOU SHOULD EXPECT: Some feelings of bloating in the abdomen. Passage of more gas than usual.  Walking can help get rid of the air that was put into your GI tract during the procedure and reduce the bloating. If you had a lower endoscopy (such as a colonoscopy or flexible sigmoidoscopy) you may notice spotting of blood in your stool or on the toilet paper. If you underwent a bowel prep for your procedure, you may not have a normal bowel movement for a few days.  Please Note:  You might notice some irritation and congestion in your nose or some drainage.  This is from the oxygen used during your procedure.  There is no need for concern and it should clear up in a day or so.  SYMPTOMS TO REPORT IMMEDIATELY:   Following lower endoscopy (colonoscopy or flexible sigmoidoscopy):  Excessive amounts of blood in the stool  Significant tenderness or worsening of abdominal pains  Swelling of the abdomen that is new, acute  Fever of 100F or higher     For urgent or emergent issues, a gastroenterologist can be reached at any hour by calling [phone].   DIET:  We do recommend a small meal at first, but then you may proceed to your regular diet.  Drink plenty of fluids but you should avoid alcoholic beverages for 24 hours.  ACTIVITY:  You should plan to take it easy for the rest of today and you should NOT DRIVE or use heavy machinery until tomorrow (because of the  sedation medicines used during the test).    FOLLOW UP: Our staff will call the number listed on your records the next business day following your procedure to check on you and address any questions or concerns that you may have regarding the information given to you following your procedure. If we do not reach you, we will leave a message.  However, if you are feeling well and you are not experiencing any problems, there is no need to return our call.  We will assume that you have returned to your regular daily activities without incident.  If any biopsies were taken you will be contacted by phone or by letter within the next 1-3 weeks.  Please call us at [phone] if you have not heard about the biopsies in 3 weeks.    SIGNATURES/CONFIDENTIALITY: You and/or your care partner have signed paperwork which will be entered into your electronic medical record.  These signatures attest to the fact that that the information above on your After Visit Summary has been reviewed and is understood.  Full responsibility of the confidentiality of this discharge information lies with you and/or your care-partner. 

## 2017-10-06 NOTE — Progress Notes (Signed)
Pt. Reports no change in his medical or surgical history since his pre-visit 09/23/2017.

## 2017-10-06 NOTE — Progress Notes (Signed)
Called to room to assist during endoscopic procedure.  Patient ID and intended procedure confirmed with present staff. Received instructions for my participation in the procedure from the performing physician.  

## 2017-10-06 NOTE — Op Note (Signed)
[facility] Patient Name: Treven Holtman Procedure Date: 10/06/2017 10:55 AM MRN: [id number] Endoscopist: Jerene Bears , MD Age: 52 Referring MD:  Date of Birth: 1966-05-31 Gender: Male Account #: [id number] Procedure:                Colonoscopy Indications:              High risk colon cancer surveillance: Personal                            history of multiple (3 or more) adenomas, Last                            colonoscopy 3 years ago Medicines:                Monitored Anesthesia Care Procedure:                Pre-Anesthesia Assessment:                           - Prior to the procedure, a History and Physical                            was performed, and patient medications and                            allergies were reviewed. The patient's tolerance of                            previous anesthesia was also reviewed. The risks                            and benefits of the procedure and the sedation                            options and risks were discussed with the patient.                            All questions were answered, and informed consent                            was obtained. Prior Anticoagulants: The patient has                            taken no previous anticoagulant or antiplatelet                            agents. ASA Grade Assessment: II - A patient with                            mild systemic disease. After reviewing the risks                            and benefits, the patient was deemed in  satisfactory condition to undergo the procedure.                           After obtaining informed consent, the colonoscope                            was passed under direct vision. Throughout the                            procedure, the patient's blood pressure, pulse, and                            oxygen saturations were monitored continuously. The                            Model CF-HQ190L [id number]) scope was  introduced                            through the anus and advanced to the the cecum,                            identified by appendiceal orifice and ileocecal                            valve. The colonoscopy was performed without                            difficulty. The patient tolerated the procedure                            well. The quality of the bowel preparation was                            good. The ileocecal valve, appendiceal orifice, and                            rectum were photographed. Scope In: 11:09:10 AM Scope Out: 11:23:02 AM Scope Withdrawal Time: 0 hours 12 minutes 57 seconds  Total Procedure Duration: 0 hours 13 minutes 52 seconds  Findings:                 The digital rectal exam was normal.                           Five sessile polyps were found in the sigmoid                            colon, descending colon, transverse colon, hepatic                            flexure and ascending colon. The polyps were 3 to 5                            mm in size. These polyps were removed with a cold  snare. Resection and retrieval were complete.                           The exam was otherwise without abnormality on                            direct and retroflexion views. Complications:            No immediate complications. Estimated Blood Loss:     Estimated blood loss was minimal. Impression:               - Five 3 to 5 mm polyps in the sigmoid colon, in                            the descending colon, in the transverse colon, at                            the hepatic flexure and in the ascending colon,                            removed with a cold snare. Resected and retrieved.                           - The examination was otherwise normal on direct                            and retroflexion views. Recommendation:           - Patient has a contact number available for                            emergencies. The signs and symptoms  of potential                            delayed complications were discussed with the                            patient. Return to normal activities tomorrow.                            Written discharge instructions were provided to the                            patient.                           - Resume previous diet.                           - Continue present medications.                           - Await pathology results.                           - Repeat colonoscopy is recommended for  surveillance. The colonoscopy date will be                            determined after pathology results from today's                            exam become available for review. Jerene Bears, MD 10/06/2017 11:26:08 AM This report has been signed electronically.

## 2017-10-07 ENCOUNTER — Telehealth: Payer: Self-pay | Admitting: *Deleted

## 2017-10-07 NOTE — Telephone Encounter (Signed)
  Follow up Call-  Call back number 10/06/2017  Post procedure Call Back phone  # [phone]  Permission to leave phone message Yes  Some recent data might be hidden     Patient questions:  Do you have a fever, pain , or abdominal swelling? No. Pain Score  0 *  Have you tolerated food without any problems? Yes.    Have you been able to return to your normal activities? Yes.    Do you have any questions about your discharge instructions: Diet   No. Medications  No. Follow up visit  No.  Do you have questions or concerns about your Care? No.  Actions: * If pain score is 4 or above: No action needed, pain <4.

## 2017-10-11 ENCOUNTER — Encounter: Payer: Self-pay | Admitting: Internal Medicine

## 2017-12-16 ENCOUNTER — Ambulatory Visit (INDEPENDENT_AMBULATORY_CARE_PROVIDER_SITE_OTHER): Payer: Managed Care, Other (non HMO) | Admitting: Family Medicine

## 2017-12-16 ENCOUNTER — Encounter: Payer: Self-pay | Admitting: Family Medicine

## 2017-12-16 VITALS — BP 145/95 | HR 68 | Temp 97.0°F | Ht 70.0 in | Wt 200.0 lb

## 2017-12-16 DIAGNOSIS — K219 Gastro-esophageal reflux disease without esophagitis: Secondary | ICD-10-CM

## 2017-12-16 MED ORDER — PANTOPRAZOLE SODIUM 40 MG PO TBEC: 40.0000 mg | DELAYED_RELEASE_TABLET | Freq: Every day | ORAL | 2 refills | Status: DC

## 2017-12-16 NOTE — Progress Notes (Signed)
   HPI  Patient presents today here with concern for ulcer.  Patient explains that at times he overuses NSAIDs.  He uses BC powders as well as Aleve for headaches.  Over the last few weeks he has not been doing this much.  However he is concerned about ulcer after developing what he describes as reflux symptoms for the last week or so.  Patient states that his esophageal irritation that he gets when he drinks a soda or eats certain spicy foods, or overuse his NSAIDs has a sensation in his back.  It is a specific spot that bothers him whenever he has stomach irritation.  He has had pain in that area now for a week persistently and it seems to be getting worse.  He tried ranitidine this morning with no improvement.  He does not drink alcohol routinely, he drinks approximately 1 drink every 6 months. He has not taken NSAIDs recently. He states that when he has headaches frequently he may take 800 mg of ibuprofen or 3 or 4 Goody powders in 1 day.  PMH: Smoking status noted ROS: Per HPI  Objective: BP (!) 145/95   Pulse 68   Temp (!) 97 F (36.1 C) (Oral)   Ht 5\' 10"  (1.778 m)   Wt 200 lb (90.7 kg)   BMI 28.70 kg/m  Gen: NAD, alert, cooperative with exam HEENT: NCAT, EOMI, PERRL CV: RRR, good S1/S2, no murmur Resp: CTABL, no wheezes, non-labored Abd: SNTND, BS present, no guarding or organomegaly MSK: No tenderness to palpation of the area in his back that is where he believes that he has referred pain to. Ext: No edema, warm Neuro: Alert and oriented, No gross deficits  Assessment and plan:  #GERD Patient with possible referred pain to a small area of his back, it sounds unusual, however he does have specific recurrent symptoms after eating spicy foods, carbonated beverages, and at times when you would expect to have reflux. He is concerned about peptic ulcer disease, he has had symptoms now for 1 to 2 weeks. Start PPI, if improved go ahead and finish 23-month course If not  improved recommended considering GI work-up   Meds ordered this encounter  Medications  . pantoprazole (PROTONIX) 40 MG tablet    Sig: Take 1 tablet (40 mg total) by mouth daily.    Dispense:  30 tablet    Refill:  [place], MD [facility] 12/16/2017, 3:35 PM

## 2017-12-16 NOTE — Patient Instructions (Signed)
Great to meet you!  Take Protonix 1 pill once daily for at least 1 week to see if it works.  If you have good symptom relief continue taking it for 3 months.  If you have partial symptom relief or if symptoms improve and then return please feel free to come back for follow-up as you may need further work-up.

## 2017-12-18 ENCOUNTER — Other Ambulatory Visit: Payer: Self-pay | Admitting: Family Medicine

## 2018-01-09 ENCOUNTER — Other Ambulatory Visit: Payer: Self-pay | Admitting: Family Medicine

## 2018-01-30 ENCOUNTER — Other Ambulatory Visit: Payer: Self-pay | Admitting: Family Medicine

## 2018-02-03 ENCOUNTER — Other Ambulatory Visit: Payer: Self-pay | Admitting: Family Medicine

## 2018-03-16 ENCOUNTER — Other Ambulatory Visit: Payer: Self-pay | Admitting: Family Medicine

## 2018-03-16 NOTE — Telephone Encounter (Signed)
Please review and advise.

## 2018-03-29 ENCOUNTER — Ambulatory Visit (INDEPENDENT_AMBULATORY_CARE_PROVIDER_SITE_OTHER): Payer: Managed Care, Other (non HMO) | Admitting: Family Medicine

## 2018-03-29 ENCOUNTER — Encounter: Payer: Self-pay | Admitting: Family Medicine

## 2018-03-29 VITALS — BP 122/80 | HR 74 | Temp 97.0°F | Ht 70.0 in | Wt 203.4 lb

## 2018-03-29 DIAGNOSIS — S60451A Superficial foreign body of left index finger, initial encounter: Secondary | ICD-10-CM

## 2018-03-29 DIAGNOSIS — S6992XA Unspecified injury of left wrist, hand and finger(s), initial encounter: Secondary | ICD-10-CM | POA: Diagnosis not present

## 2018-03-29 MED ORDER — CEPHALEXIN 500 MG PO CAPS: 500.0000 mg | ORAL_CAPSULE | Freq: Four times a day (QID) | ORAL | 0 refills | Status: DC

## 2018-03-29 NOTE — Progress Notes (Signed)
BP 122/80   Pulse 74   Temp (!) 97 F (36.1 C) (Oral)   Ht 5\' 10"  (1.778 m)   Wt 92.3 kg   BMI 29.18 kg/m    Subjective:    Patient ID: Frederick Barr, male    DOB: [birth year], 52 y.o.   MRN: [id number]  HPI: Frederick Barr is a 52 y.o. male presenting on 03/29/2018 for Hand Pain (Left pointer - x 1 day. Patient was moving wood yesterday and states there is some wood stuck under his nail that he can not get out.)  Patient was moving rotting wood with his father in law yesterday and a piece of wood went underneath his left pointer finger nail. He did some warm soaks in water and clipped his nail, but could not get the foreign debris out from under his nail. This morning he noted that the tip of his finger was warmer and very tender to the touch. He is not having any fever or chills.  He has not been able to remove the junk that end up under his fingernail.  He does admit that he was not wearing gloves because he could not find them.  Relevant past medical, surgical, family and social history reviewed and updated as indicated. Interim medical history since our last visit reviewed. Allergies and medications reviewed and updated.  Review of Systems  Constitutional: Negative for chills, fatigue and fever.  Skin: Positive for wound (Dirt and debris underneath left pointer fingernail, very tender to the touch).    Per HPI unless specifically indicated above   Allergies as of 03/29/2018   No Known Allergies     Medication List        Accurate as of 03/29/18  9:04 AM. Always use your most recent med list.          amLODipine-benazepril 5-40 MG capsule Commonly known as:  LOTREL TAKE 1 CAPSULE BY MOUTH EVERY DAY   cephALEXin 500 MG capsule Commonly known as:  KEFLEX Take 1 capsule (500 mg total) by mouth 4 (four) times daily.   hydrochlorothiazide 25 MG tablet Commonly known as:  HYDRODIURIL TAKE 1 TABLET (25 MG TOTAL) BY MOUTH DAILY. AS DIRECTED   pantoprazole 40 MG  tablet Commonly known as:  PROTONIX Take 1 tablet (40 mg total) by mouth daily.   rosuvastatin 10 MG tablet Commonly known as:  CRESTOR TAKE 1 TABLET BY MOUTH EVERY DAY   Vitamin D (Ergocalciferol) 50000 units Caps capsule Commonly known as:  DRISDOL TAKE 1 CAPSULE (50,000 UNITS TOTAL) BY MOUTH EVERY 7 (SEVEN) DAYS.          Objective:    BP 122/80   Pulse 74   Temp (!) 97 F (36.1 C) (Oral)   Ht 5\' 10"  (1.778 m)   Wt 92.3 kg   BMI 29.18 kg/m   Wt Readings from Last 3 Encounters:  03/29/18 92.3 kg  12/16/17 90.7 kg  10/06/17 92.1 kg    Physical Exam  Constitutional: He appears well-developed and well-nourished.  Cardiovascular: Normal rate, regular rhythm, normal heart sounds and intact distal pulses.  Pulmonary/Chest: Effort normal and breath sounds normal.  Skin: Skin is warm, dry and intact. He is not diaphoretic. No pallor.         Left Pointer Finger Debris Removal: Patient verbal consent was obtained. Site was prepped and betadine was used. Nerve block was done on 2nd phalange using 7cc 2% lidocaine. Once finger was numbed, nailbed elevator and Adson's forceps were  used to get dirt and foreign debris out of fingernail space. Risk factors were discussed and patient told to return if symptoms worsen. Assessment & Plan:   Problem List Items Addressed This Visit    None    Visit Diagnoses    Injury to fingernail of left hand, initial encounter    -  Primary   Foreign body and dirt under fingernail   Relevant Medications   cephALEXin (KEFLEX) 500 MG capsule      Injury to pointer fingernail Take Keflex for infection and ibuprofen as needed for pain. Patient told the lidocaine finger block can last up to 7 to 8 hours. Call back if finger becomes worse.  Follow up plan: Return if symptoms worsen or fail to improve.  Counseling provided for all of the vaccine components No orders of the defined types were placed in this encounter.  Patient seen and  examined with Ree Kida, PA student.  Agree with assessment and plan above.  Able to remove the majority of the debris and will give antibiotic for possible beginnings of infection Caryl Pina, MD [facility] 03/29/2018, 9:04 AM

## 2018-04-10 ENCOUNTER — Other Ambulatory Visit: Payer: Managed Care, Other (non HMO)

## 2018-04-10 DIAGNOSIS — I10 Essential (primary) hypertension: Secondary | ICD-10-CM

## 2018-04-10 DIAGNOSIS — E7800 Pure hypercholesterolemia, unspecified: Secondary | ICD-10-CM

## 2018-04-10 DIAGNOSIS — N400 Enlarged prostate without lower urinary tract symptoms: Secondary | ICD-10-CM

## 2018-04-10 DIAGNOSIS — K219 Gastro-esophageal reflux disease without esophagitis: Secondary | ICD-10-CM

## 2018-04-10 DIAGNOSIS — E559 Vitamin D deficiency, unspecified: Secondary | ICD-10-CM

## 2018-04-10 DIAGNOSIS — Z0000 Encounter for general adult medical examination without abnormal findings: Secondary | ICD-10-CM

## 2018-04-11 LAB — CBC WITH DIFFERENTIAL/PLATELET
BASOS: 1 %
Basophils Absolute: 0.1 10*3/uL (ref 0.0–0.2)
EOS (ABSOLUTE): 0.3 10*3/uL (ref 0.0–0.4)
EOS: 4 %
HEMATOCRIT: 42.5 % (ref 37.5–51.0)
HEMOGLOBIN: 14.5 g/dL (ref 13.0–17.7)
Immature Grans (Abs): 0 10*3/uL (ref 0.0–0.1)
Immature Granulocytes: 0 %
Lymphocytes Absolute: 2 10*3/uL (ref 0.7–3.1)
Lymphs: 29 %
MCH: 27.9 pg (ref 26.6–33.0)
MCHC: 34.1 g/dL (ref 31.5–35.7)
MCV: 82 fL (ref 79–97)
MONOCYTES: 10 %
Monocytes Absolute: 0.7 10*3/uL (ref 0.1–0.9)
Neutrophils Absolute: 3.8 10*3/uL (ref 1.4–7.0)
Neutrophils: 56 %
Platelets: 255 10*3/uL (ref 150–450)
RBC: 5.19 x10E6/uL (ref 4.14–5.80)
RDW: 13 % (ref 12.3–15.4)
WBC: 6.7 10*3/uL (ref 3.4–10.8)

## 2018-04-11 LAB — HEPATIC FUNCTION PANEL
ALBUMIN: 4.4 g/dL (ref 3.5–5.5)
ALT: 26 IU/L (ref 0–44)
AST: 18 IU/L (ref 0–40)
Alkaline Phosphatase: 68 IU/L (ref 39–117)
Bilirubin Total: 0.2 mg/dL (ref 0.0–1.2)
Bilirubin, Direct: 0.07 mg/dL (ref 0.00–0.40)
Total Protein: 6.3 g/dL (ref 6.0–8.5)

## 2018-04-11 LAB — BMP8+EGFR
BUN/Creatinine Ratio: 12 (ref 9–20)
BUN: 13 mg/dL (ref 6–24)
CO2: 23 mmol/L (ref 20–29)
CREATININE: 1.07 mg/dL (ref 0.76–1.27)
Calcium: 9.4 mg/dL (ref 8.7–10.2)
Chloride: 101 mmol/L (ref 96–106)
GFR, EST AFRICAN AMERICAN: 90 mL/min/{1.73_m2} (ref >59)
GFR, EST NON AFRICAN AMERICAN: 78 mL/min/{1.73_m2} (ref >59)
Glucose: 91 mg/dL (ref 65–99)
Potassium: 4.1 mmol/L (ref 3.5–5.2)
SODIUM: 140 mmol/L (ref 134–144)

## 2018-04-11 LAB — PSA, TOTAL AND FREE
PSA FREE PCT: 38 %
PSA FREE: 0.19 ng/mL
Prostate Specific Ag, Serum: 0.5 ng/mL (ref 0.0–4.0)

## 2018-04-11 LAB — LIPID PANEL
CHOL/HDL RATIO: 3.5 ratio (ref 0.0–5.0)
Cholesterol, Total: 122 mg/dL (ref 100–199)
HDL: 35 mg/dL — AB (ref >39)
LDL CALC: 52 mg/dL (ref 0–99)
TRIGLYCERIDES: 175 mg/dL — AB (ref 0–149)
VLDL Cholesterol Cal: 35 mg/dL (ref 5–40)

## 2018-04-11 LAB — VITAMIN D 25 HYDROXY (VIT D DEFICIENCY, FRACTURES): VIT D 25 HYDROXY: 50.5 ng/mL (ref 30.0–100.0)

## 2018-04-13 ENCOUNTER — Ambulatory Visit (INDEPENDENT_AMBULATORY_CARE_PROVIDER_SITE_OTHER): Payer: Managed Care, Other (non HMO) | Admitting: Family Medicine

## 2018-04-13 ENCOUNTER — Encounter: Payer: Self-pay | Admitting: Family Medicine

## 2018-04-13 VITALS — BP 122/86 | HR 68 | Temp 97.3°F | Ht 70.0 in | Wt 203.0 lb

## 2018-04-13 DIAGNOSIS — R51 Headache: Secondary | ICD-10-CM

## 2018-04-13 DIAGNOSIS — Z87898 Personal history of other specified conditions: Secondary | ICD-10-CM

## 2018-04-13 DIAGNOSIS — E782 Mixed hyperlipidemia: Secondary | ICD-10-CM

## 2018-04-13 DIAGNOSIS — I10 Essential (primary) hypertension: Secondary | ICD-10-CM

## 2018-04-13 DIAGNOSIS — E7800 Pure hypercholesterolemia, unspecified: Secondary | ICD-10-CM

## 2018-04-13 DIAGNOSIS — R0789 Other chest pain: Secondary | ICD-10-CM

## 2018-04-13 DIAGNOSIS — K219 Gastro-esophageal reflux disease without esophagitis: Secondary | ICD-10-CM

## 2018-04-13 DIAGNOSIS — R519 Headache, unspecified: Secondary | ICD-10-CM

## 2018-04-13 DIAGNOSIS — Z0000 Encounter for general adult medical examination without abnormal findings: Secondary | ICD-10-CM

## 2018-04-13 DIAGNOSIS — E559 Vitamin D deficiency, unspecified: Secondary | ICD-10-CM

## 2018-04-13 MED ORDER — ICOSAPENT ETHYL 1 G PO CAPS: 2.0000 | ORAL_CAPSULE | Freq: Two times a day (BID) | ORAL | 3 refills | Status: DC

## 2018-04-13 MED ORDER — PANTOPRAZOLE SODIUM 40 MG PO TBEC: 40.0000 mg | DELAYED_RELEASE_TABLET | Freq: Every day | ORAL | 3 refills | Status: DC

## 2018-04-13 NOTE — Patient Instructions (Addendum)
Continue current medications. Continue good therapeutic lifestyle changes which include good diet and exercise. Fall precautions discussed with patient. If an FOBT was given today- please return it to our front desk. If you are over 54 years old - you may need Prevnar 46 or the adult Pneumonia vaccine.  **Flu shots are available--- please call and schedule a FLU-CLINIC appointment**  After your visit with [place] today you will receive a survey in the mail or online from [employer] regarding your care with [place]. Please take a moment to fill this out. Your feedback is very important to [place] as you can help [place] better understand your patient needs as well as improve your experience and satisfaction. WE CARE ABOUT YOU!!!   The EKG today was good and this is good.  We still will arrange for the patient to see the cardiologist because of the history of the burning sensation in his chest that radiated to his back and down his left arm.  We will get an appointment with Dr. Percival Spanish who comes next-door. The patient should continue with his Protonix He should only take Tylenol if needed for aches pains and fever and absolutely take no kind of anti-inflammatory medicine like ibuprofen and Aleve We will also try to arrange a visit to further evaluate these headaches he has had for lifelong.  Of time that do not respond well to medication and a remote history of a seizure disorder.  We will get this visit with the neurologist, Dr. Jannifer Franklin.  Once again the patient should only take Tylenol if needed for headaches. We will also consider after the above has been worked up of getting an appointment back with a gastroenterologist for an endoscopy. The patient should make a commitment to completely stop smoking.Marland Kitchen He should avoid irritating foods on his stomach and esophagus and try to eat things that are more braked and broiled

## 2018-04-13 NOTE — Progress Notes (Signed)
Subjective:    Patient ID: Frederick Barr, male    DOB: 21-Nov-1965, 52 y.o.   MRN: [id number]  HPI  Patient is here today for annual wellness exam and follow up of chronic medical problems which includes hypertension and gerd. He is taking medication regularly.  She is here for his physical exam today.  He wants to discuss his indigestion issues.  His weight is stable and his BMI is 29.18.  The initial blood pressure was elevated but the repeat was better at 122/86.  He has had lab work done and we will review this with him during the visit today.  He will get a urinalysis today.  He is 28 and did have a colonoscopy in March of this year.  He has had previous adenomas.  He had more polyps found this time.  This time he also had a tubular adenoma.  Patient himself has BPH family history of heart disease hyperlipidemia hypertension and vitamin D deficiency.  He is currently taking Lotrel and hydro-chlorothiazide 25 mg along with pantoprazole and vitamin D replacement.  His recent lab work had a PSA that was stable compared to a year ago at 0.5.  The blood sugar was good at 91 creatinine, the most important kidney function test was within normal limits.  All of the electrolytes including potassium are good.  The CBC was within normal limits with a normal white blood cell count a good hemoglobin and adequate platelet count.  Cholesterol numbers with traditional lipid testing had an LDL-C cholesterol that was good at 52.  Triglycerides however were elevated at 175 and should be less than 150.  The good cholesterol remains low.  The vitamin D level was excellent and he will hopefully continue with his current dose of vitamin D D 50,000 units.  All liver function tests were normal.  Labs and episode of severe burning in his chest after he had been taking ibuprofen and burning actually went to his back and into the left arm.  He had taken several ibuprofen for couple days in a row.  This lasted for several days.  He  had previous problems with his stomach.  He did have a stress test in the past.  He had a recent colonoscopy that did have polyps.  His brother has had 2 strokes who is 82 years old.  The patient denies any chest pain since this happen and he is not taking any more ibuprofen he is on Protonix now.  He denies any shortness of breath.  He denies any trouble with nausea vomiting diarrhea blood in the stool or black tarry bowel movements and says he is passing his water well.  He is due to get his eye exam soon.  He did have an exercise stress test in September 2015 and this was interpreted as normal with a normal blood pressure response and no signs of any ischemia.    Patient Active Problem List   Diagnosis Date Noted  . Vitamin D deficiency 09/06/2014  . Hyperlipidemia 03/06/2014  . Benign prostatic hyperplasia without lower urinary tract symptoms 03/06/2014  . Family history of heart disease 03/06/2014  . Hypertension 09/11/2013   Outpatient Encounter Medications as of 04/13/2018  Medication Sig  . amLODipine-benazepril (LOTREL) 5-40 MG capsule TAKE 1 CAPSULE BY MOUTH EVERY DAY  . hydrochlorothiazide (HYDRODIURIL) 25 MG tablet TAKE 1 TABLET (25 MG TOTAL) BY MOUTH DAILY. AS DIRECTED  . pantoprazole (PROTONIX) 40 MG tablet Take 1 tablet (40 mg total) by  mouth daily.  . rosuvastatin (CRESTOR) 10 MG tablet TAKE 1 TABLET BY MOUTH EVERY DAY  . Vitamin D, Ergocalciferol, (DRISDOL) 50000 units CAPS capsule TAKE 1 CAPSULE (50,000 UNITS TOTAL) BY MOUTH EVERY 7 (SEVEN) DAYS.  . [DISCONTINUED] cephALEXin (KEFLEX) 500 MG capsule Take 1 capsule (500 mg total) by mouth 4 (four) times daily.  . [DISCONTINUED] 0.9 %  sodium chloride infusion    No facility-administered encounter medications on file as of 04/13/2018.      Review of Systems  Constitutional: Negative.   HENT: Negative.   Eyes: Negative.   Respiratory: Negative.   Cardiovascular: Negative.   Gastrointestinal: Negative.        GERD     Endocrine: Negative.   Genitourinary: Negative.   Musculoskeletal: Negative.   Skin: Negative.   Allergic/Immunologic: Negative.   Neurological: Negative.   Hematological: Negative.   Psychiatric/Behavioral: Negative.        Objective:   Physical Exam  Constitutional: He is oriented to person, place, and time. He appears well-developed and well-nourished. He appears distressed.  The patient is pleasant but somewhat concerned about events with his brother who is 26 who is had 2 strokes.  He also gives a history of his parents each having a stroke.  On top of this he is concerned because in the past couple months he has had a severe episode of burning in his chest radiating to his back into his left arm.  This was after taking several doses of ibuprofen.  The pain and discomfort did get better once he was started on Protonix.  HENT:  Head: Normocephalic and atraumatic.  Right Ear: External ear normal.  Left Ear: External ear normal.  Nose: Nose normal.  Mouth/Throat: Oropharynx is clear and moist. No oropharyngeal exudate.  Minimal earwax  Eyes: Pupils are equal, round, and reactive to light. Conjunctivae and EOM are normal. Right eye exhibits no discharge. Left eye exhibits no discharge. No scleral icterus.  Neck: Normal range of motion. Neck supple. No thyromegaly present.  No bruits thyromegaly or anterior cervical adenopathy  Cardiovascular: Normal rate, regular rhythm, normal heart sounds and intact distal pulses.  No murmur heard. Heart was regular at 72/min without murmur and with good pedal pulses  Pulmonary/Chest: Effort normal and breath sounds normal. No respiratory distress. He has no wheezes. He has no rales. He exhibits no tenderness.  Clear anteriorly and posteriorly and no axillary adenopathy no chest wall tenderness  Abdominal: Soft. Bowel sounds are normal. He exhibits no mass. There is no tenderness.  No liver or spleen enlargement no epigastric tenderness no masses  no bruits and no inguinal adenopathy  Genitourinary: Rectum normal and penis normal.  Genitourinary Comments: The prostate was slightly enlarged but smooth.  The rectal exam was negative.  The external genitalia were within normal limits with no inguinal hernias being palpated.  Musculoskeletal: Normal range of motion. He exhibits no edema, tenderness or deformity.  Lymphadenopathy:    He has no cervical adenopathy.  Neurological: He is alert and oriented to person, place, and time. He has normal reflexes. No cranial nerve deficit.  Reflexes in the lower extremities were 3+ and equal bilaterally.  Skin: Skin is warm and dry. No rash noted.  Psychiatric: He has a normal mood and affect. His behavior is normal. Judgment and thought content normal.  The patient's mood affect and behavior were all normal other than a concern more about his health related issues and what has just happened to his brother  with 2 strokes.  Nursing note and vitals reviewed.  BP (!) 142/92 (BP Location: Left Arm)   Pulse 68   Temp (!) 97.3 F (36.3 C) (Oral)   Ht 5\' 10"  (1.778 m)   Wt 203 lb (92.1 kg)   BMI 29.13 kg/m    EKG within normal limits     Assessment & Plan:  1. Annual physical exam -Further testing is recommended.  EKG today was normal.  He will be a referral to cardiology and referral to neurology and possibly referral back to gastroenterology for endoscopy. - Urinalysis, Complete  2. Gastroesophageal reflux disease, esophagitis presence not specified -Continue with Protonix  3. Essential hypertension -Continue with current blood pressure treatment - Ambulatory referral to Cardiology  4. Pure hypercholesterolemia -Continue with Crestor - Ambulatory referral to Cardiology  5. Vitamin D deficiency -Continue with vitamin D replacement  6. Nonintractable headache, unspecified chronicity pattern, unspecified headache type -Only take Tylenol if needed for pain - Ambulatory referral to  Neurology  7. History of seizure -This is remote. - Ambulatory referral to Neurology  8. Burning chest pain -Possible referral back to gastroenterology and patient should continue with Protonix - Ambulatory referral to Cardiology - EKG 12-Lead  9.  Elevated triglycerides -Start Vascepa 2 g twice daily  Meds ordered this encounter  Medications  . pantoprazole (PROTONIX) 40 MG tablet    Sig: Take 1 tablet (40 mg total) by mouth daily.    Dispense:  90 tablet    Refill:  3  . Icosapent Ethyl (VASCEPA) 1 g CAPS    Sig: Take 2 capsules (2 g total) by mouth 2 (two) times daily.    Dispense:  120 capsule    Refill:  3   Patient Instructions  Continue current medications. Continue good therapeutic lifestyle changes which include good diet and exercise. Fall precautions discussed with patient. If an FOBT was given today- please return it to our front desk. If you are over 49 years old - you may need Prevnar 55 or the adult Pneumonia vaccine.  **Flu shots are available--- please call and schedule a FLU-CLINIC appointment**  After your visit with [place] today you will receive a survey in the mail or online from [employer] regarding your care with [place]. Please take a moment to fill this out. Your feedback is very important to [place] as you can help [place] better understand your patient needs as well as improve your experience and satisfaction. WE CARE ABOUT YOU!!!   The EKG today was good and this is good.  We still will arrange for the patient to see the cardiologist because of the history of the burning sensation in his chest that radiated to his back and down his left arm.  We will get an appointment with Dr. Percival Spanish who comes next-door. The patient should continue with his Protonix He should only take Tylenol if needed for aches pains and fever and absolutely take no kind of anti-inflammatory medicine like ibuprofen and Aleve We will also try to arrange a visit to further evaluate these headaches  he has had for lifelong.  Of time that do not respond well to medication and a remote history of a seizure disorder.  We will get this visit with the neurologist, Dr. Jannifer Franklin.  Once again the patient should only take Tylenol if needed for headaches. We will also consider after the above has been worked up of getting an appointment back with a gastroenterologist for an endoscopy. The patient should make  a commitment to completely stop smoking.Marland Kitchen He should avoid irritating foods on his stomach and esophagus and try to eat things that are more braked and broiled  Arrie Senate MD

## 2018-04-14 LAB — URINALYSIS, COMPLETE
BILIRUBIN UA: NEGATIVE
Glucose, UA: NEGATIVE
KETONES UA: NEGATIVE
LEUKOCYTES UA: NEGATIVE
NITRITE UA: NEGATIVE
Protein, UA: NEGATIVE
RBC UA: NEGATIVE
Specific Gravity, UA: 1.01 (ref 1.005–1.030)
UUROB: 0.2 mg/dL (ref 0.2–1.0)
pH, UA: 6 (ref 5.0–7.5)

## 2018-04-14 LAB — MICROSCOPIC EXAMINATION
Bacteria, UA: NONE SEEN
Casts: NONE SEEN /lpf
Epithelial Cells (non renal): NONE SEEN /hpf (ref 0–10)
WBC UA: NONE SEEN /HPF (ref 0–5)

## 2018-04-24 ENCOUNTER — Ambulatory Visit (INDEPENDENT_AMBULATORY_CARE_PROVIDER_SITE_OTHER): Payer: Managed Care, Other (non HMO) | Admitting: Diagnostic Neuroimaging

## 2018-04-24 ENCOUNTER — Encounter: Payer: Self-pay | Admitting: Diagnostic Neuroimaging

## 2018-04-24 VITALS — BP 128/85 | HR 71 | Ht 70.0 in | Wt 202.8 lb

## 2018-04-24 DIAGNOSIS — G44209 Tension-type headache, unspecified, not intractable: Secondary | ICD-10-CM | POA: Diagnosis not present

## 2018-04-24 DIAGNOSIS — G43009 Migraine without aura, not intractable, without status migrainosus: Secondary | ICD-10-CM

## 2018-04-24 DIAGNOSIS — G4440 Drug-induced headache, not elsewhere classified, not intractable: Secondary | ICD-10-CM

## 2018-04-24 DIAGNOSIS — T3995XA Adverse effect of unspecified nonopioid analgesic, antipyretic and antirheumatic, initial encounter: Secondary | ICD-10-CM

## 2018-04-24 MED ORDER — PROPRANOLOL HCL 20 MG PO TABS: 20.0000 mg | ORAL_TABLET | Freq: Two times a day (BID) | ORAL | 6 refills | Status: DC

## 2018-04-24 MED ORDER — RIZATRIPTAN BENZOATE 10 MG PO TBDP: 10.0000 mg | ORAL_TABLET | ORAL | 11 refills | Status: DC | PRN

## 2018-04-24 NOTE — Progress Notes (Signed)
[facility]  PATIENT: Frederick Barr DOB: [birth year]  REFERRING CLINICIAN: Laurance Flatten, D HISTORY FROM: patient  REASON FOR VISIT: new consult   HISTORICAL  CHIEF COMPLAINT:  Chief Complaint  Patient presents with  . Headache    rm 6, New Pt, "headaches since 4-5th grade; Ibuprofen causes heartburn, Tylenol ES twice daily helps; most days of week I have a headache, time of day varies, typically frontal, sometimes occipital"  . Seizures    "none since early 20's"    HISTORY OF PRESENT ILLNESS:   52 year old male here for evaluation of headaches.  Patient reports headaches since the age 73 years old with pain, tension on the top and back of head.  Sometimes patient has throbbing sensation.  No nausea, vomiting, photophobia or phonophobia.  Some family history of migraine in his brother.  Since age 23 years old patient has been taking daily Tylenol or ibuprofen for headaches.  Patient continues to have headaches 80% of the time.  He has tried Tylenol, ibuprofen, BC powder, Goody powder in the past.  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  He was previously on phenobarbital and Tegretol.  Patient after seizures and has been able to successfully be weaned off of antiseizure medication.  No recent triggering or aggravating factors.  REVIEW OF SYSTEMS: Full 14 system review of systems performed and negative with exception of: headache snoring.  ALLERGIES: No Known Allergies  HOME MEDICATIONS: Outpatient Medications Prior to Visit  Medication Sig Dispense Refill  . amLODipine-benazepril (LOTREL) 5-40 MG capsule TAKE 1 CAPSULE BY MOUTH EVERY DAY 90 capsule 1  . hydrochlorothiazide (HYDRODIURIL) 25 MG tablet TAKE 1 TABLET (25 MG TOTAL) BY MOUTH DAILY. AS DIRECTED 90 tablet 0  . Icosapent Ethyl (VASCEPA) 1 g CAPS Take 2 capsules (2 g total) by mouth 2 (two) times daily. 120 capsule 3  . pantoprazole (PROTONIX) 40 MG tablet  Take 1 tablet (40 mg total) by mouth daily. 90 tablet 3  . rosuvastatin (CRESTOR) 10 MG tablet TAKE 1 TABLET BY MOUTH EVERY DAY 90 tablet 3  . Vitamin D, Ergocalciferol, (DRISDOL) 50000 units CAPS capsule TAKE 1 CAPSULE (50,000 UNITS TOTAL) BY MOUTH EVERY 7 (SEVEN) DAYS. 12 capsule 3   No facility-administered medications prior to visit.     PAST MEDICAL HISTORY: Past Medical History:  Diagnosis Date  . Headache   . Hyperlipidemia   . Hypertension   . Seizures ([place])    as a teenager/ last seizure at age 2    PAST SURGICAL HISTORY: Past Surgical History:  Procedure Laterality Date  . COLONOSCOPY    . HEMORRHOID SURGERY  2012   excised overlying skin on prolapsed internal hemorrhoids and evacuated a large number of clots.       FAMILY HISTORY: Family History  Problem Relation Age of Onset  . Heart disease Mother   . Stroke Mother   . Hypertension Mother   . Stroke Father   . Alcoholism Father   . Stroke Brother   . Cancer - Lung Maternal Grandmother   . Colon cancer Neg Hx   . Esophageal cancer Neg Hx   . Rectal cancer Neg Hx   . Stomach cancer Neg Hx     SOCIAL HISTORY: Social History   Socioeconomic History  . Marital status: Married    Spouse name: Faythe Dingwall  . Number of children: 4  . Years of education: masters  . Highest education level: Not  on file  Occupational History    Comment: Manufacturing systems engineer  Social Needs  . Financial resource strain: Not on file  . Food insecurity:    Worry: Not on file    Inability: Not on file  . Transportation needs:    Medical: Not on file    Non-medical: Not on file  Tobacco Use  . Smoking status: Current Every Day Smoker    Packs/day: 0.15    Types: Cigarettes    Start date: 07/12/2005  . Smokeless tobacco: Never Used  . Tobacco comment: smokes 2-3 cigarettes a day  Substance and Sexual Activity  . Alcohol use: Not Currently    Comment: quit 2004  . Drug use: No  . Sexual activity: Not on file  Lifestyle  .  Physical activity:    Days per week: Not on file    Minutes per session: Not on file  . Stress: Not on file  Relationships  . Social connections:    Talks on phone: Not on file    Gets together: Not on file    Attends religious service: Not on file    Active member of club or organization: Not on file    Attends meetings of clubs or organizations: Not on file    Relationship status: Not on file  . Intimate partner violence:    Fear of current or ex partner: Not on file    Emotionally abused: Not on file    Physically abused: Not on file    Forced sexual activity: Not on file  Other Topics Concern  . Not on file  Social History Narrative   Lives with wife, son'   Caffeine=sodas 3-4 daily     PHYSICAL EXAM  GENERAL EXAM/CONSTITUTIONAL: Vitals:  Vitals:   04/24/18 1310  BP: 128/85  Pulse: 71  Weight: 202 lb 12.8 oz (92 kg)  Height: 5\' 10"  (1.778 m)     Body mass index is 29.1 kg/m. Wt Readings from Last 3 Encounters:  04/24/18 202 lb 12.8 oz (92 kg)  04/13/18 203 lb (92.1 kg)  03/29/18 203 lb 6.4 oz (92.3 kg)     Patient is in no distress; well developed, nourished and groomed; neck is supple  CARDIOVASCULAR:  Examination of carotid arteries is normal; no carotid bruits  Regular rate and rhythm, no murmurs  Examination of peripheral vascular system by observation and palpation is normal  EYES:  Ophthalmoscopic exam of optic discs and posterior segments is normal; no papilledema or hemorrhages  Visual Acuity Screening   Right eye Left eye Both eyes  Without correction:     With correction: 20/20 20/20      MUSCULOSKELETAL:  Gait, strength, tone, movements noted in Neurologic exam below  NEUROLOGIC: MENTAL STATUS:  No flowsheet data found.  awake, alert, oriented to person, place and time  recent and remote memory intact  normal attention and concentration  language fluent, comprehension intact, naming intact  fund of knowledge  appropriate  CRANIAL NERVE:   2nd - no papilledema on fundoscopic exam  2nd, 3rd, 4th, 6th - pupils equal and reactive to light, visual fields full to confrontation, extraocular muscles intact, no nystagmus  5th - facial sensation symmetric  7th - facial strength symmetric  8th - hearing intact  9th - palate elevates symmetrically, uvula midline  11th - shoulder shrug symmetric  12th - tongue protrusion midline  MOTOR:   normal bulk and tone, full strength in the BUE, BLE  SENSORY:   normal  and symmetric to light touch, temperature, vibration  COORDINATION:   finger-nose-finger, fine finger movements normal  REFLEXES:   deep tendon reflexes present and symmetric  GAIT/STATION:   narrow based gait     DIAGNOSTIC DATA (LABS, IMAGING, TESTING) - I reviewed patient records, labs, notes, testing and imaging myself where available.  Lab Results  Component Value Date   WBC 6.7 04/10/2018   HGB 14.5 04/10/2018   HCT 42.5 04/10/2018   MCV 82 04/10/2018   PLT 255 04/10/2018      Component Value Date/Time   NA 140 04/10/2018 0809   K 4.1 04/10/2018 0809   CL 101 04/10/2018 0809   CO2 23 04/10/2018 0809   GLUCOSE 91 04/10/2018 0809   BUN 13 04/10/2018 0809   CREATININE 1.07 04/10/2018 0809   CALCIUM 9.4 04/10/2018 0809   PROT 6.3 04/10/2018 0809   ALBUMIN 4.4 04/10/2018 0809   AST 18 04/10/2018 0809   ALT 26 04/10/2018 0809   ALKPHOS 68 04/10/2018 0809   BILITOT 0.2 04/10/2018 0809   GFRNONAA 78 04/10/2018 0809   GFRAA 90 04/10/2018 0809   Lab Results  Component Value Date   CHOL 122 04/10/2018   HDL 35 (L) 04/10/2018   LDLCALC 52 04/10/2018   TRIG 175 (H) 04/10/2018   CHOLHDL 3.5 04/10/2018   No results found for: HGBA1C No results found for: VITAMINB12 Lab Results  Component Value Date   TSH 1.730 02/16/2017       ASSESSMENT AND PLAN  52 y.o. 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   Dx:  1. Tension headache   2. Analgesic overuse headache   3. Atypical migraine       PLAN:  - check MRI brain (with and without); due to increasing and worsening headache severity and frequency  - consider sleep study  - gradually reduce tylenol (analgesic overuse headache)  - start propranolol 20mg  twice a day (for headache prevention)  - rizatriptan 10mg  as needed for breakthrough headache; may repeat x 1 after 2 hours; max 2 tabs per day or 8 per month  - To prevent or relieve headaches, try the following:   Cool Compress. Lie down and place a cool compress on your head.   Avoid headache triggers. If certain foods or odors seem to have triggered your migraines in the past, avoid them. A headache diary might help you identify triggers.   Include physical activity in your daily routine.   Manage stress. Find healthy ways to cope with the stressors, such as delegating tasks on your to-do list.   Practice relaxation techniques. Try deep breathing, yoga, massage and visualization.   Eat regularly. Eating regularly scheduled meals and maintaining a healthy diet might help prevent headaches. Also, drink plenty of fluids.   Follow a regular sleep schedule. Sleep deprivation might contribute to headaches  Consider biofeedback. With this mind-body technique, you learn to control certain bodily functions - such as muscle tension, heart rate and blood pressure - to prevent headaches or reduce headache pain.  Orders Placed This Encounter  Procedures  . MR BRAIN W WO CONTRAST   Meds ordered this encounter  Medications  . propranolol (INDERAL) 20 MG tablet    Sig: Take 1 tablet (20 mg total) by mouth 2 (two) times daily.    Dispense:  60 tablet    Refill:  6  . rizatriptan (MAXALT-MLT) 10 MG disintegrating tablet    Sig:  Take 1 tablet (10 mg total) by mouth as needed for migraine. May repeat in 2 hours if needed    Dispense:  9 tablet     Refill:  11   Return in about 6 months (around 10/24/2018).    Penni Bombard, MD 71/59/5396, 7:28 PM Certified in Neurology, Neurophysiology and Neuroimaging  [facility] [address], [place], [place] [address] [phone]

## 2018-04-24 NOTE — Patient Instructions (Signed)
-   check MRI brain (with and without)   - consider sleep study  - gradually reduce tylenol (analgesic overuse headache)  - start propranolol 20mg  twice a day (for headache prevention)  - rizatriptan 10mg  as needed for breakthrough headache; may repeat x 1 after 2 hours; max 2 tabs per day or 8 per month  - To prevent or relieve headaches, try the following:   Cool Compress. Lie down and place a cool compress on your head.   Avoid headache triggers. If certain foods or odors seem to have triggered your migraines in the past, avoid them. A headache diary might help you identify triggers.   Include physical activity in your daily routine.   Manage stress. Find healthy ways to cope with the stressors, such as delegating tasks on your to-do list.   Practice relaxation techniques. Try deep breathing, yoga, massage and visualization.   Eat regularly. Eating regularly scheduled meals and maintaining a healthy diet might help prevent headaches. Also, drink plenty of fluids.   Follow a regular sleep schedule. Sleep deprivation might contribute to headaches  Consider biofeedback. With this mind-body technique, you learn to control certain bodily functions - such as muscle tension, heart rate and blood pressure - to prevent headaches or reduce headache pain.

## 2018-04-25 ENCOUNTER — Telehealth: Payer: Self-pay | Admitting: Diagnostic Neuroimaging

## 2018-04-25 NOTE — Telephone Encounter (Signed)
LVM for pt to be aware of this. Also, left [facility] phone number of [phone] and to call if he has not heard in the next 2-3 business days.

## 2018-04-25 NOTE — Telephone Encounter (Signed)
[employer] order sent to [facility]. They obtain the auth and will reach out to the pt to schedule.  °

## 2018-05-14 NOTE — Progress Notes (Deleted)
Cardiology Office Note   Date:  05/14/2018   ID:  Frederick Barr, DOB Apr 04, 1966, MRN [id number]  PCP:  Chipper Herb, MD  Cardiologist:   No primary care provider on file. Referring:  ***  No chief complaint on file.    History of Present Illness: Frederick Barr is a 52 y.o. male who presents for ***    He was seen by Dr. Aundra Dubin in 2015.  He had significant cardiovascular risk factors.   He had a negative POET (Plain Old Exercise Treadmill) at that time.  ***   Past Medical History:  Diagnosis Date  . Headache   . Hyperlipidemia   . Hypertension   . Seizures ([place])    as a teenager/ last seizure at age 29    Past Surgical History:  Procedure Laterality Date  . COLONOSCOPY    . HEMORRHOID SURGERY  2012   excised overlying skin on prolapsed internal hemorrhoids and evacuated a large number of clots.        Current Outpatient Medications  Medication Sig Dispense Refill  . amLODipine-benazepril (LOTREL) 5-40 MG capsule TAKE 1 CAPSULE BY MOUTH EVERY DAY 90 capsule 1  . hydrochlorothiazide (HYDRODIURIL) 25 MG tablet TAKE 1 TABLET (25 MG TOTAL) BY MOUTH DAILY. AS DIRECTED 90 tablet 0  . Icosapent Ethyl (VASCEPA) 1 g CAPS Take 2 capsules (2 g total) by mouth 2 (two) times daily. 120 capsule 3  . pantoprazole (PROTONIX) 40 MG tablet Take 1 tablet (40 mg total) by mouth daily. 90 tablet 3  . propranolol (INDERAL) 20 MG tablet Take 1 tablet (20 mg total) by mouth 2 (two) times daily. 60 tablet 6  . rizatriptan (MAXALT-MLT) 10 MG disintegrating tablet Take 1 tablet (10 mg total) by mouth as needed for migraine. May repeat in 2 hours if needed 9 tablet 11  . rosuvastatin (CRESTOR) 10 MG tablet TAKE 1 TABLET BY MOUTH EVERY DAY 90 tablet 3  . Vitamin D, Ergocalciferol, (DRISDOL) 50000 units CAPS capsule TAKE 1 CAPSULE (50,000 UNITS TOTAL) BY MOUTH EVERY 7 (SEVEN) DAYS. 12 capsule 3   No current facility-administered medications for this visit.     Allergies:   Patient has no  known allergies.     ROS:  Please see the history of present illness.   Otherwise, review of systems are positive for {NONE DEFAULTED:18576::"none"}.   All other systems are reviewed and negative.    PHYSICAL EXAM: VS:  There were no vitals taken for this visit. , BMI There is no height or weight on file to calculate BMI. GENERAL:  Well appearing HEENT:  Pupils equal round and reactive, fundi not visualized, oral mucosa unremarkable NECK:  No jugular venous distention, waveform within normal limits, carotid upstroke brisk and symmetric, no bruits, no thyromegaly LYMPHATICS:  No cervical, inguinal adenopathy LUNGS:  Clear to auscultation bilaterally BACK:  No CVA tenderness CHEST:  Unremarkable HEART:  PMI not displaced or sustained,S1 and S2 within normal limits, no S3, no S4, no clicks, no rubs, *** murmurs ABD:  Flat, positive bowel sounds normal in frequency in pitch, no bruits, no rebound, no guarding, no midline pulsatile mass, no hepatomegaly, no splenomegaly EXT:  2 plus pulses throughout, no edema, no cyanosis no clubbing SKIN:  No rashes no nodules NEURO:  Cranial nerves II through XII grossly intact, motor grossly intact throughout PSYCH:  Cognitively intact, oriented to person place and time    EKG:  EKG {ACTION; IS/IS VXB:93903009} ordered today. The ekg ordered  today demonstrates ***   Recent Labs: 04/10/2018: ALT 26; BUN 13; Creatinine, Ser 1.07; Hemoglobin 14.5; Platelets 255; Potassium 4.1; Sodium 140    Lipid Panel    Component Value Date/Time   CHOL 122 04/10/2018 0809   TRIG 175 (H) 04/10/2018 0809   TRIG 211 (H) 02/16/2017 0907   HDL 35 (L) 04/10/2018 0809   HDL 34 (L) 02/16/2017 0907   CHOLHDL 3.5 04/10/2018 0809   LDLCALC 52 04/10/2018 0809   LDLCALC 142 (H) 03/06/2014 1137      Wt Readings from Last 3 Encounters:  04/24/18 202 lb 12.8 oz (92 kg)  04/13/18 203 lb (92.1 kg)  03/29/18 203 lb 6.4 oz (92.3 kg)      Other studies  Reviewed: Additional studies/ records that were reviewed today include: ***. Review of the above records demonstrates:  Please see elsewhere in the note.  ***   ASSESSMENT AND PLAN:  CAD:  ***  DYSLIPIDEMIA:  **  HTN:  ***   Current medicines are reviewed at length with the patient today.  The patient {ACTIONS; HAS/DOES NOT HAVE:19233} concerns regarding medicines.  The following changes have been made:  {PLAN; NO CHANGE:13088:s}  Labs/ tests ordered today include: *** No orders of the defined types were placed in this encounter.    Disposition:   FU with ***    Signed, Minus Breeding, MD  05/14/2018 1:11 PM    [facility]

## 2018-05-16 ENCOUNTER — Ambulatory Visit: Payer: Managed Care, Other (non HMO) | Admitting: Cardiology

## 2018-05-22 ENCOUNTER — Ambulatory Visit
Admission: RE | Admit: 2018-05-22 | Discharge: 2018-05-22 | Disposition: A | Discharge status: Home / Self Care | Payer: Managed Care, Other (non HMO) | Source: Ambulatory Visit | Attending: Diagnostic Neuroimaging | Admitting: Diagnostic Neuroimaging

## 2018-05-22 DIAGNOSIS — G4440 Drug-induced headache, not elsewhere classified, not intractable: Secondary | ICD-10-CM

## 2018-05-22 DIAGNOSIS — T3995XA Adverse effect of unspecified nonopioid analgesic, antipyretic and antirheumatic, initial encounter: Secondary | ICD-10-CM

## 2018-05-22 DIAGNOSIS — G44209 Tension-type headache, unspecified, not intractable: Secondary | ICD-10-CM

## 2018-05-22 DIAGNOSIS — G43009 Migraine without aura, not intractable, without status migrainosus: Secondary | ICD-10-CM | POA: Diagnosis not present

## 2018-05-22 MED ORDER — GADOBENATE DIMEGLUMINE 529 MG/ML IV SOLN
18.0000 mL | Freq: Once | INTRAVENOUS | Status: AC | PRN
  Administered 2018-05-22: 18 mL via INTRAVENOUS

## 2018-05-23 NOTE — Telephone Encounter (Signed)
Novella Rob: [id number] (exp. 05/19/18 to 08/17/18) patient was scheduled at GI for 05/22/18

## 2018-05-24 ENCOUNTER — Telehealth: Payer: Self-pay | Admitting: *Deleted

## 2018-05-24 NOTE — Telephone Encounter (Signed)
Spoke to pt and relayed that his MRI brain results per Dr. Leta Baptist were unremarkable.  Pt verbalized understanding.

## 2018-05-24 NOTE — Telephone Encounter (Signed)
-----   Message from Penni Bombard, MD sent at 05/23/2018  6:23 PM EST ----- Unremarkable imaging results. Please call patient. Continue current plan. -VRP

## 2018-06-02 ENCOUNTER — Other Ambulatory Visit: Payer: Self-pay | Admitting: Family Medicine

## 2018-06-13 ENCOUNTER — Other Ambulatory Visit: Payer: Self-pay | Admitting: Family Medicine

## 2018-06-26 NOTE — Progress Notes (Signed)
Cardiology Office Note   Date:  06/27/2018   ID:  Frederick Barr, DOB 1966-03-04, MRN [id number]  PCP:  Chipper Herb, MD  Cardiologist:   No primary care provider on file. Referring:  Chipper Herb, MD  Chief Complaint  Patient presents with  . Follow-up    Last seen by Dr. Aundra Dubin in 2015.      History of Present Illness: Frederick Barr is a 52 y.o. male who presents for evaluation of family history of coronary disease.  He was referred by  Chipper Herb, MD for evaluation of SOB.   He is new to me but was seen by Dr. Aundra Dubin in 2015.  He had a negative POET (Plain Old Exercise Treadmill) at that time.   He himself has no past cardiac history.  He does smoke cigarettes.  He has hypertension and dyslipidemia.  He does have some mild dyspnea with exertion but denies any chest pressure, neck or arm discomfort.  He has no palpitations, presyncope or syncope.  He has no PND or orthopnea.  He has no weight gain or edema.  He does a lot of walking at work and he does some golfing but he does not exercise routinely.  He does try to eat right.  He finds this hard however.   Past Medical History:  Diagnosis Date  . Headache   . Hyperlipidemia   . Hypertension   . Seizures ([place])    as a teenager/ last seizure at age 73    Past Surgical History:  Procedure Laterality Date  . COLONOSCOPY    . HEMORRHOID SURGERY  2012   excised overlying skin on prolapsed internal hemorrhoids and evacuated a large number of clots.        Current Outpatient Medications  Medication Sig Dispense Refill  . amLODipine-benazepril (LOTREL) 5-40 MG capsule TAKE 1 CAPSULE BY MOUTH EVERY DAY 90 capsule 1  . hydrochlorothiazide (HYDRODIURIL) 25 MG tablet TAKE 1 TABLET (25 MG TOTAL) BY MOUTH DAILY. AS DIRECTED 90 tablet 0  . pantoprazole (PROTONIX) 40 MG tablet Take 1 tablet (40 mg total) by mouth daily. 90 tablet 3  . propranolol (INDERAL) 20 MG tablet Take 1 tablet (20 mg total) by mouth 2 (two) times  daily. 60 tablet 6  . rizatriptan (MAXALT-MLT) 10 MG disintegrating tablet Take 1 tablet (10 mg total) by mouth as needed for migraine. May repeat in 2 hours if needed 9 tablet 11  . rosuvastatin (CRESTOR) 10 MG tablet TAKE 1 TABLET BY MOUTH EVERY DAY 90 tablet 3  . VASCEPA 1 g CAPS TAKE 2 CAPSULES BY MOUTH 2 TIMES DAILY. 360 capsule 0  . Vitamin D, Ergocalciferol, (DRISDOL) 50000 units CAPS capsule TAKE 1 CAPSULE (50,000 UNITS TOTAL) BY MOUTH EVERY 7 (SEVEN) DAYS. 12 capsule 3   No current facility-administered medications for this visit.     Allergies:   Patient has no known allergies.    Social History:  The patient  reports that he has been smoking cigarettes. He started smoking about 12 years ago. He has been smoking about 0.15 packs per day. He has never used smokeless tobacco. He reports previous alcohol use. He reports that he does not use drugs.   Family History:  The patient's family history includes Alcoholism in his father; Cancer - Lung in his maternal grandmother; Heart disease (age of onset: 23) in his mother; Hypertension in his mother; Stroke in his father and mother; Stroke (age of onset: 54) in his  brother.    ROS:  Please see the history of present illness.   Otherwise, review of systems are positive for none.   All other systems are reviewed and negative.    PHYSICAL EXAM: VS:  BP 128/76 (BP Location: Left Arm, Patient Position: Sitting, Cuff Size: Normal)   Pulse (!) 54   Ht 5\' 7"  (1.702 m)   Wt 209 lb (94.8 kg)   BMI 32.73 kg/m  , BMI Body mass index is 32.73 kg/m. GENERAL:  Well appearing HEENT:  Pupils equal round and reactive, fundi not visualized, oral mucosa unremarkable NECK:  No jugular venous distention, waveform within normal limits, carotid upstroke brisk and symmetric, no bruits, no thyromegaly LYMPHATICS:  No cervical, inguinal adenopathy LUNGS:  Clear to auscultation bilaterally BACK:  No CVA tenderness CHEST:  Unremarkable HEART:  PMI not  displaced or sustained,S1 and S2 within normal limits, no S3, no S4, no clicks, no rubs, no murmurs ABD:  Flat, positive bowel sounds normal in frequency in pitch, no bruits, no rebound, no guarding, no midline pulsatile mass, no hepatomegaly, no splenomegaly EXT:  2 plus pulses throughout, no edema, no cyanosis no clubbing SKIN:  No rashes no nodules NEURO:  Cranial nerves II through XII grossly intact, motor grossly intact throughout PSYCH:  Cognitively intact, oriented to person place and time    EKG:  EKG is ordered today. The ekg ordered today demonstrates sinus rhythm, rate 54, axis within normal limits, intervals within normal limits, no acute ST-T wave changes.   Recent Labs: 04/10/2018: ALT 26; BUN 13; Creatinine, Ser 1.07; Hemoglobin 14.5; Platelets 255; Potassium 4.1; Sodium 140    Lipid Panel    Component Value Date/Time   CHOL 122 04/10/2018 0809   TRIG 175 (H) 04/10/2018 0809   TRIG 211 (H) 02/16/2017 0907   HDL 35 (L) 04/10/2018 0809   HDL 34 (L) 02/16/2017 0907   CHOLHDL 3.5 04/10/2018 0809   LDLCALC 52 04/10/2018 0809   LDLCALC 142 (H) 03/06/2014 1137      Wt Readings from Last 3 Encounters:  06/27/18 209 lb (94.8 kg)  04/24/18 202 lb 12.8 oz (92 kg)  04/13/18 203 lb (92.1 kg)      Other studies Reviewed: Additional studies/ records that were reviewed today include: Labs. Review of the above records demonstrates:  Please see elsewhere in the note.     ASSESSMENT AND PLAN:  HTN:   His blood pressures well controlled.  He will continue the meds as listed.  DYSLIPIDEMIA: His LDL was 52 and HDL 35.  He will continue the meds as listed.  TOBACCO ABUSE: He smokes only 2 cigarettes a day.  I suggested that that was 2 cigarettes too many.  RISK REDUCTION: Given his risk factors and strong family history as well as his dyspnea on exertion screening with a coronary calcium score is indicated.  Further management and therapeutic goal as well as risk medication  will be based on this result.  Current medicines are reviewed at length with the patient today.  The patient does not have concerns regarding medicines.  The following changes have been made:  no change  Labs/ tests ordered today include:   Orders Placed This Encounter  Procedures  . CT CARDIAC SCORING  . EKG 12-Lead     Disposition:   FU with as needed     Signed, Minus Breeding, MD  06/27/2018 5:16 PM    [place]

## 2018-06-27 ENCOUNTER — Encounter: Payer: Self-pay | Admitting: Cardiology

## 2018-06-27 ENCOUNTER — Ambulatory Visit (INDEPENDENT_AMBULATORY_CARE_PROVIDER_SITE_OTHER): Payer: Managed Care, Other (non HMO) | Admitting: Cardiology

## 2018-06-27 VITALS — BP 128/76 | HR 54 | Ht 67.0 in | Wt 209.0 lb

## 2018-06-27 DIAGNOSIS — I10 Essential (primary) hypertension: Secondary | ICD-10-CM

## 2018-06-27 DIAGNOSIS — R0609 Other forms of dyspnea: Secondary | ICD-10-CM | POA: Diagnosis not present

## 2018-06-27 DIAGNOSIS — Z8249 Family history of ischemic heart disease and other diseases of the circulatory system: Secondary | ICD-10-CM

## 2018-06-27 DIAGNOSIS — Z720 Tobacco use: Secondary | ICD-10-CM

## 2018-06-27 NOTE — Patient Instructions (Signed)
Medication Instructions:  Your physician recommends that you continue on your current medications as directed. Please refer to the Current Medication list given to you today.  If you need a refill on your cardiac medications before your next appointment, please call your pharmacy.   Lab work: None ordered If you have labs (blood work) drawn today and your tests are completely normal, you will receive your results only by: Marland Kitchen MyChart Message (if you have MyChart) OR . A paper copy in the mail If you have any lab test that is abnormal or we need to change your treatment, we will call you to review the results.  Testing/Procedures: Your physician recommends that you have a Cardiac CT Calcium Score  Follow-Up: At [facility], you and your health needs are our priority.  As part of our continuing mission to provide you with exceptional heart care, we have created designated Provider Care Teams.  These Care Teams include your primary Cardiologist (physician) and Advanced Practice Providers (APPs -  Physician Assistants and Nurse Practitioners) who all work together to provide you with the care you need, when you need it. You will need a follow up appointment as needed   Any Other Special Instructions Will Be Listed Below (If Applicable).

## 2018-07-19 ENCOUNTER — Inpatient Hospital Stay: Admission: RE | Admit: 2018-07-19 | Payer: Managed Care, Other (non HMO) | Source: Ambulatory Visit

## 2018-08-24 ENCOUNTER — Other Ambulatory Visit: Payer: Self-pay | Admitting: Family Medicine

## 2018-08-30 ENCOUNTER — Inpatient Hospital Stay: Admission: RE | Admit: 2018-08-30 | Payer: Managed Care, Other (non HMO) | Source: Ambulatory Visit

## 2018-09-22 ENCOUNTER — Inpatient Hospital Stay: Admission: RE | Admit: 2018-09-22 | Payer: Managed Care, Other (non HMO) | Source: Ambulatory Visit

## 2018-10-07 ENCOUNTER — Other Ambulatory Visit: Payer: Self-pay | Admitting: Family Medicine

## 2018-10-09 NOTE — Telephone Encounter (Signed)
Last seen 06/27/18  DWM  Last lipid 04/10/18

## 2018-10-13 ENCOUNTER — Other Ambulatory Visit: Payer: Self-pay | Admitting: *Deleted

## 2018-10-13 ENCOUNTER — Other Ambulatory Visit: Payer: Self-pay

## 2018-10-13 ENCOUNTER — Ambulatory Visit (INDEPENDENT_AMBULATORY_CARE_PROVIDER_SITE_OTHER): Payer: Managed Care, Other (non HMO) | Admitting: Family Medicine

## 2018-10-13 DIAGNOSIS — M25552 Pain in left hip: Secondary | ICD-10-CM

## 2018-10-13 DIAGNOSIS — R51 Headache: Secondary | ICD-10-CM

## 2018-10-13 DIAGNOSIS — R519 Headache, unspecified: Secondary | ICD-10-CM

## 2018-10-13 DIAGNOSIS — I10 Essential (primary) hypertension: Secondary | ICD-10-CM | POA: Diagnosis not present

## 2018-10-13 DIAGNOSIS — E559 Vitamin D deficiency, unspecified: Secondary | ICD-10-CM | POA: Diagnosis not present

## 2018-10-13 DIAGNOSIS — E782 Mixed hyperlipidemia: Secondary | ICD-10-CM | POA: Diagnosis not present

## 2018-10-13 DIAGNOSIS — K219 Gastro-esophageal reflux disease without esophagitis: Secondary | ICD-10-CM | POA: Insufficient documentation

## 2018-10-13 DIAGNOSIS — M545 Low back pain, unspecified: Secondary | ICD-10-CM

## 2018-10-13 DIAGNOSIS — Z8249 Family history of ischemic heart disease and other diseases of the circulatory system: Secondary | ICD-10-CM

## 2018-10-13 NOTE — Progress Notes (Signed)
Virtual Visit via telephone Note I connected with@ on 10/13/18 by telephone and verified that I am speaking with the correct person or authorized healthcare agent using two identifiers. Frederick Barr is currently located at home and there are no unauthorized people in close proximity. I, Redge Gainer, MD, completed this visit while in a private location in my home .Marland Kitchen  This visit type was conducted due to national recommendations for restrictions regarding the COVID-19 Pandemic (e.g. social distancing).  This format is felt to be most appropriate for this patient at this time.  All issues noted in this document were discussed and addressed.  No physical exam was performed.    I discussed the limitations, risks, security and privacy concerns of performing an evaluation and management service by telephone and the availability of in person appointments. I also discussed with the patient that there may be a patient responsible charge related to this service. The patient expressed understanding and agreed to proceed.   Date:  10/13/2018    ID:  Frederick Barr      04-23-1966        [id number]   Patient Care Team Patient Care Team: Chipper Herb, MD as PCP - [facility])  Reason for Visit: Primary Care Follow-up     History of Present Illness & Review of Systems:     Frederick Barr is a 53 y.o. year old male primary care patient that presents today for a telehealth visit.  The patient is pleasant and doing well and is currently working from home.  He denies any chest pain pressure tightness or shortness of breath.  He denies any trouble with swallowing heartburn indigestion nausea vomiting diarrhea blood in the stool or black tarry bowel movements and is passing his water without problems.  Since seeing the neurologist and having an MRI of the head, he was switched from hydrochlorothiazide 25 mg to propranolol 20 mg twice daily.  He is not taking any Maxalt for headaches.  He says  the headaches are still present but less intense and therefore better.  He also tells me that his blood pressures at home have been running generally in the 1 20-1 30 range over the 70-80 range and sometimes as high as up in the 90s.  He understands the importance of watching his salt intake and losing weight.  He promises to do better.  He also complains with some low back pain and this was pretty severe starting in November until the end of January.  He does not recall doing anything that could have aggravated this.  The pain was predominantly in the low back and in the left hip.  It was worse with walking and certain movements but better with sitting.  This did get better and he still feels like his gait is a little bit uncomfortable at times but the pain is much less severe.  We discussed it would be a good idea to get x-rays of the low back and left hip and he will come in sometime in the next couple months and get these x-rays done once the coronavirus epidemic has resolved.  He also has an order for a cardiac CT and that has been postponed and this is a result of seeing the cardiologist because of the strong family history of heart disease.  He plans to still get this.  His medications were reviewed and his problem list was reviewed and he does not need any refills.  He  is taking all his medicines regularly.  Review of systems as stated otherwise negative for body systems left unmentioned.   The patient does not have symptoms concerning for COVID-19 infection (fever, chills, cough, or new shortness of breath).      Current Medications (Verified) Allergies as of 10/13/2018   No Known Allergies     Medication List       Accurate as of October 13, 2018 11:51 AM. Always use your most recent med list.        amLODipine-benazepril 5-40 MG capsule Commonly known as:  LOTREL TAKE 1 CAPSULE BY MOUTH EVERY DAY   hydrochlorothiazide 25 MG tablet Commonly known as:  HYDRODIURIL TAKE 1 TABLET (25 MG  TOTAL) BY MOUTH DAILY. AS DIRECTED   pantoprazole 40 MG tablet Commonly known as:  PROTONIX Take 1 tablet (40 mg total) by mouth daily.   propranolol 20 MG tablet Commonly known as:  INDERAL Take 1 tablet (20 mg total) by mouth 2 (two) times daily.   rizatriptan 10 MG disintegrating tablet Commonly known as:  MAXALT-MLT Take 1 tablet (10 mg total) by mouth as needed for migraine. May repeat in 2 hours if needed   rosuvastatin 10 MG tablet Commonly known as:  CRESTOR TAKE 1 TABLET BY MOUTH EVERY DAY   Vascepa 1 g Caps Generic drug:  Icosapent Ethyl TAKE 2 CAPSULES BY MOUTH 2 TIMES DAILY.   Vitamin D (Ergocalciferol) 1.25 MG (50000 UT) Caps capsule Commonly known as:  DRISDOL TAKE 1 CAPSULE (50,000 UNITS TOTAL) BY MOUTH EVERY 7 (SEVEN) DAYS.           Allergies (Verified)    Patient has no known allergies.  Past Medical History Past Medical History:  Diagnosis Date   Headache    Hyperlipidemia    Hypertension    Seizures ([place])    as a teenager/ last seizure at age 53    Past Surgical History:  Procedure Laterality Date   COLONOSCOPY     HEMORRHOID SURGERY  2012   excised overlying skin on prolapsed internal hemorrhoids and evacuated a large number of clots.       Social History   Socioeconomic History   Marital status: Married    Spouse name: Faythe Dingwall   Number of children: 4   Years of education: masters   Highest education level: Not on file  Occupational History    Comment: Estate agent strain: Not on file   Food insecurity:    Worry: Not on file    Inability: Not on file   Transportation needs:    Medical: Not on file    Non-medical: Not on file  Tobacco Use   Smoking status: Current Every Day Smoker    Packs/day: 0.15    Types: Cigarettes    Start date: 07/12/2005   Smokeless tobacco: Never Used   Tobacco comment: smokes 2-3 cigarettes a day  Substance and Sexual Activity   Alcohol use:  Not Currently    Comment: quit 2004   Drug use: No   Sexual activity: Not on file  Lifestyle   Physical activity:    Days per week: Not on file    Minutes per session: Not on file   Stress: Not on file  Relationships   Social connections:    Talks on phone: Not on file    Gets together: Not on file    Attends religious service: Not on file    Active member  of club or organization: Not on file    Attends meetings of clubs or organizations: Not on file    Relationship status: Not on file  Other Topics Concern   Not on file  Social History Narrative   Lives with wife, son'   Caffeine=sodas 3-4 daily     Family History  Problem Relation Age of Onset   Heart disease Mother 59   Stroke Mother    Hypertension Mother    Stroke Father    Alcoholism Father    Stroke Brother 63   Cancer - Lung Maternal Grandmother    Colon cancer Neg Hx    Esophageal cancer Neg Hx    Rectal cancer Neg Hx    Stomach cancer Neg Hx        Labs/Other Tests and Data Reviewed:    Wt Readings from Last 3 Encounters:  06/27/18 209 lb (94.8 kg)  04/24/18 202 lb 12.8 oz (92 kg)  04/13/18 203 lb (92.1 kg)   Temp Readings from Last 3 Encounters:  04/13/18 (!) 97.3 F (36.3 C) (Oral)  03/29/18 (!) 97 F (36.1 C) (Oral)  12/16/17 (!) 97 F (36.1 C) (Oral)   BP Readings from Last 3 Encounters:  06/27/18 128/76  04/24/18 128/85  04/13/18 122/86   Pulse Readings from Last 3 Encounters:  06/27/18 (!) 54  04/24/18 71  04/13/18 68     No results found for: HGBA1C Lab Results  Component Value Date   LDLCALC 52 04/10/2018   CREATININE 1.07 04/10/2018       Chemistry      Component Value Date/Time   NA 140 04/10/2018 0809   K 4.1 04/10/2018 0809   CL 101 04/10/2018 0809   CO2 23 04/10/2018 0809   BUN 13 04/10/2018 0809   CREATININE 1.07 04/10/2018 0809      Component Value Date/Time   CALCIUM 9.4 04/10/2018 0809   ALKPHOS 68 04/10/2018 0809   AST 18 04/10/2018  0809   ALT 26 04/10/2018 0809   BILITOT 0.2 04/10/2018 0809         OBSERVATIONS/ OBJECTIVE:     The patient was alert and had a good handle on all his medical issues and appears to be trying to check his blood pressure regularly and follow through with recommendations from the cardiologist.  He does plan to come in sometime and get LS spine and left hip films.  His low back and left hip are currently better.  He will notify us if there is any need for any refills.  Physical exam deferred due to nature of telephonic visit.  ASSESSMENT & PLAN    Time:   Today, I have spent  30  minutes with the patient via telephone discussing the above including Covid precautions.     Visit Diagnoses: 1. Gastroesophageal reflux disease, esophagitis presence not specified -Continue with pantoprazole and diet avoidance  2. Essential hypertension -Continue with current medicines and watch sodium intake -Continue to work aggressively on weight loss through diet and exercise  3. Vitamin D deficiency -Continue with vitamin D replacement  4. Hypercholesterolemia with hypertriglyceridemia -Continue with aggressive therapeutic lifestyle changes and current Crestor  5. Nonintractable headache, unspecified chronicity pattern, unspecified headache type -Follow-up with neurology as planned  6.  Family history of heart disease -Follow-up with cardiac CT as planned when virus issues are diminished         Follow-up Instructions:  Because of the low back pain, an order was placed for  the patient to come in in the next month or so to get low back films and left hip films.  This would at least be a baseline for the low back pain he has been having. He plans to follow-up and get his CT scheduled by the cardiologist to further evaluate any heart disease. It was emphasized to him to continue with all efforts to watch sodium intake and exercise on a regular basis and lose weight through diet and  exercise. He will continue to monitor blood pressure readings at home He will check his monitor and have his reading compared to readings by a nurse who does not manually.  This is just to make sure that his monitor is working correctly.    Continue current medications. Continue good therapeutic lifestyle changes which include good diet and exercise. Fall precautions discussed with patient. If an FOBT was given today- please return it to our front desk. If you are over 82 years old - you may need Prevnar 27 or the adult Pneumonia vaccine.  **Flu shots are available--- please call and schedule a FLU-CLINIC appointment**  After your visit with [place] today you will receive a survey in the mail or online from [employer] regarding your care with [place]. Please take a moment to fill this out. Your feedback is very important to [place] as you can help [place] better understand your patient needs as well as improve your experience and satisfaction. WE CARE ABOUT YOU!!!     I discussed the assessment and treatment plan with the patient. The patient was provided an opportunity to ask questions and all were answered. The patient agreed with the plan and demonstrated an understanding of the instructions.   The patient was advised to call back or seek an in-person evaluation if the symptoms worsen or if the condition fails to improve as anticipated.  The above assessment and management plan was discussed with the patient. The patient verbalized understanding of and has agreed to the management plan. Patient is aware to call the clinic if symptoms persist or worsen. Patient is aware when to return to the clinic for a follow-up visit. Patient educated on when it is appropriate to go to the emergency department.    Chipper Herb, MD [address], [place], [place] [address] Ph [phone]   Arrie Senate MD

## 2018-10-23 ENCOUNTER — Telehealth: Payer: Self-pay | Admitting: *Deleted

## 2018-10-23 ENCOUNTER — Inpatient Hospital Stay: Admission: RE | Admit: 2018-10-23 | Payer: Managed Care, Other (non HMO) | Source: Ambulatory Visit

## 2018-10-23 NOTE — Telephone Encounter (Signed)
Called pt and informed him due to current COVID 19 pandemic, our office is severely reducing in person visits in order to minimize the risk to our patients and healthcare providers. We recommend to convert your appointment to a video visit. We'll take all precautions to reduce any security or privacy concerns. He consented to video visit and to move appt to this Thurs. Pt's email is [email or web address].  Pt understands that the [employer] webex software must be downloaded and operational on the device pt plans to use for the visit.  Updated EMR.  He had no questions,  verbalized understanding, appreciation. Webex scheduled, e mail sent.

## 2018-10-26 ENCOUNTER — Encounter: Payer: Self-pay | Admitting: Diagnostic Neuroimaging

## 2018-10-26 ENCOUNTER — Other Ambulatory Visit: Payer: Self-pay

## 2018-10-26 ENCOUNTER — Ambulatory Visit (INDEPENDENT_AMBULATORY_CARE_PROVIDER_SITE_OTHER): Payer: Managed Care, Other (non HMO) | Admitting: Diagnostic Neuroimaging

## 2018-10-26 DIAGNOSIS — G44209 Tension-type headache, unspecified, not intractable: Secondary | ICD-10-CM | POA: Diagnosis not present

## 2018-10-26 DIAGNOSIS — G43009 Migraine without aura, not intractable, without status migrainosus: Secondary | ICD-10-CM | POA: Diagnosis not present

## 2018-10-26 DIAGNOSIS — G4440 Drug-induced headache, not elsewhere classified, not intractable: Secondary | ICD-10-CM

## 2018-10-26 DIAGNOSIS — T3995XA Adverse effect of unspecified nonopioid analgesic, antipyretic and antirheumatic, initial encounter: Secondary | ICD-10-CM

## 2018-10-26 MED ORDER — PROPRANOLOL HCL 20 MG PO TABS: 20.0000 mg | ORAL_TABLET | Freq: Two times a day (BID) | ORAL | 12 refills | Status: DC

## 2018-10-26 NOTE — Progress Notes (Signed)
[facility]  PATIENT: Frederick Barr DOB: [birth year]  REFERRING CLINICIAN: Laurance Flatten, D HISTORY FROM: patient  REASON FOR VISIT: follow up (video visit)   HISTORICAL  CHIEF COMPLAINT:  Chief Complaint  Patient presents with   Headache    HISTORY OF PRESENT ILLNESS:   UPDATE (10/26/18, VRP): Since last visit, doing well. Symptoms are improved. Severity is mild. No alleviating or aggravating factors. Tolerating meds.     PRIOR HPI : 53 year old male here for evaluation of headaches.  Patient reports headaches since the age 32 years old with pain, tension on the top and back of head.  Sometimes patient has throbbing sensation.  No nausea, vomiting, photophobia or phonophobia.  Some family history of migraine in his brother.  Since age 53 years old patient has been taking daily Tylenol or ibuprofen for headaches.  Patient continues to have headaches 80% of the time. He has tried Tylenol, ibuprofen, BC powder, Goody powder in the past.  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  He was previously on phenobarbital and Tegretol.  Patient after seizures and has been able to successfully be weaned off of antiseizure medication.  No recent triggering or aggravating factors.   REVIEW OF SYSTEMS: Full 14 system review of systems performed and negative with exception of: as per HPI.   ALLERGIES: No Known Allergies  HOME MEDICATIONS: Outpatient Medications Prior to Visit  Medication Sig Dispense Refill   amLODipine-benazepril (LOTREL) 5-40 MG capsule TAKE 1 CAPSULE BY MOUTH EVERY DAY 90 capsule 0   pantoprazole (PROTONIX) 40 MG tablet Take 1 tablet (40 mg total) by mouth daily. 90 tablet 3   propranolol (INDERAL) 20 MG tablet Take 1 tablet (20 mg total) by mouth 2 (two) times daily. 60 tablet 6   rizatriptan (MAXALT-MLT) 10 MG disintegrating tablet Take 1 tablet (10 mg total) by mouth as needed for migraine. May repeat in  2 hours if needed 9 tablet 11   rosuvastatin (CRESTOR) 10 MG tablet TAKE 1 TABLET BY MOUTH EVERY DAY 90 tablet 3   VASCEPA 1 g CAPS TAKE 2 CAPSULES BY MOUTH 2 TIMES DAILY. 360 capsule 0   Vitamin D, Ergocalciferol, (DRISDOL) 50000 units CAPS capsule TAKE 1 CAPSULE (50,000 UNITS TOTAL) BY MOUTH EVERY 7 (SEVEN) DAYS. 12 capsule 3   No facility-administered medications prior to visit.     PAST MEDICAL HISTORY: Past Medical History:  Diagnosis Date   Headache    Hyperlipidemia    Hypertension    Seizures ([place])    as a teenager/ last seizure at age 75    PAST SURGICAL HISTORY: Past Surgical History:  Procedure Laterality Date   COLONOSCOPY     HEMORRHOID SURGERY  2012   excised overlying skin on prolapsed internal hemorrhoids and evacuated a large number of clots.       FAMILY HISTORY: Family History  Problem Relation Age of Onset   Heart disease Mother 28   Stroke Mother    Hypertension Mother    Stroke Father    Alcoholism Father    Stroke Brother 9   Cancer - Lung Maternal Grandmother    Colon cancer Neg Hx    Esophageal cancer Neg Hx    Rectal cancer Neg Hx    Stomach cancer Neg Hx     SOCIAL HISTORY: Social History   Socioeconomic History   Marital status: Married    Spouse name: Faythe Dingwall   Number of children: 4  Years of education: masters   Highest education level: Not on file  Occupational History    Comment: Estate agent strain: Not on file   Food insecurity:    Worry: Not on file    Inability: Not on file   Transportation needs:    Medical: Not on file    Non-medical: Not on file  Tobacco Use   Smoking status: Current Every Day Smoker    Packs/day: 0.15    Types: Cigarettes    Start date: 07/12/2005   Smokeless tobacco: Never Used   Tobacco comment: smokes 2-3 cigarettes a day  Substance and Sexual Activity   Alcohol use: Not Currently    Comment: quit 2004   Drug use: No     Sexual activity: Not on file  Lifestyle   Physical activity:    Days per week: Not on file    Minutes per session: Not on file   Stress: Not on file  Relationships   Social connections:    Talks on phone: Not on file    Gets together: Not on file    Attends religious service: Not on file    Active member of club or organization: Not on file    Attends meetings of clubs or organizations: Not on file    Relationship status: Not on file   Intimate partner violence:    Fear of current or ex partner: Not on file    Emotionally abused: Not on file    Physically abused: Not on file    Forced sexual activity: Not on file  Other Topics Concern   Not on file  Social History Narrative   Lives with wife, son'   Caffeine=sodas 3-4 daily     PHYSICAL EXAM  GENERAL EXAM/CONSTITUTIONAL: Vitals:  There were no vitals filed for this visit. There is no height or weight on file to calculate BMI. Wt Readings from Last 3 Encounters:  06/27/18 209 lb (94.8 kg)  04/24/18 202 lb 12.8 oz (92 kg)  04/13/18 203 lb (92.1 kg)    Patient is in no distress; well developed, nourished and groomed; neck is supple   NEUROLOGIC: MENTAL STATUS:  No flowsheet data found.  awake, alert, oriented to person, place and time  recent and remote memory intact  normal attention and concentration  language fluent, comprehension intact, naming intact  fund of knowledge appropriate  CRANIAL NERVE:   2nd, 3rd, 4th, 6th - visual fields full to confrontation, extraocular muscles intact, no nystagmus  5th - facial sensation symmetric  7th - facial strength symmetric  8th - hearing intact  11th - shoulder shrug symmetric  12th - tongue protrusion midline  MOTOR:   No drift; no tremor    DIAGNOSTIC DATA (LABS, IMAGING, TESTING) - I reviewed patient records, labs, notes, testing and imaging myself where available.  Lab Results  Component Value Date   WBC 6.7 04/10/2018   HGB 14.5  04/10/2018   HCT 42.5 04/10/2018   MCV 82 04/10/2018   PLT 255 04/10/2018      Component Value Date/Time   NA 140 04/10/2018 0809   K 4.1 04/10/2018 0809   CL 101 04/10/2018 0809   CO2 23 04/10/2018 0809   GLUCOSE 91 04/10/2018 0809   BUN 13 04/10/2018 0809   CREATININE 1.07 04/10/2018 0809   CALCIUM 9.4 04/10/2018 0809   PROT 6.3 04/10/2018 0809   ALBUMIN 4.4 04/10/2018 0809   AST 18 04/10/2018 0809  ALT 26 04/10/2018 0809   ALKPHOS 68 04/10/2018 0809   BILITOT 0.2 04/10/2018 0809   GFRNONAA 78 04/10/2018 0809   GFRAA 90 04/10/2018 0809   Lab Results  Component Value Date   CHOL 122 04/10/2018   HDL 35 (L) 04/10/2018   LDLCALC 52 04/10/2018   TRIG 175 (H) 04/10/2018   CHOLHDL 3.5 04/10/2018   No results found for: HGBA1C No results found for: VITAMINB12 Lab Results  Component Value Date   TSH 1.730 02/16/2017    05/22/18 MRI brain (with and without) demonstrating: - Few punctate scattered periventricular and subcortical and juxtacortical foci of non-specific gliosis. - No acute findings.    ASSESSMENT AND PLAN  53 y.o. 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   Dx:  1. Tension headache   2. Analgesic overuse headache   3. Atypical migraine      Virtual Visit via Video Note  I connected with Holland Falling on 10/26/18 at  3:00 PM EDT by a video enabled telemedicine application and verified that I am speaking with the correct person using two identifiers.   I discussed the limitations of evaluation and management by telemedicine and the availability of in person appointments. The patient expressed understanding and agreed to proceed.   I discussed the assessment and treatment plan with the patient. The patient was provided an opportunity to ask questions and all were answered. The patient agreed with the plan and demonstrated an understanding of the instructions.   The patient  was advised to call back or seek an in-person evaluation if the symptoms worsen or if the condition fails to improve as anticipated.  I provided 15 minutes of non-face-to-face time during this encounter.    PLAN:  - consider sleep study  - gradually reduce tylenol (analgesic overuse headache)  - continue propranolol 20mg  twice a day (for headache prevention)  - rizatriptan 10mg  as needed for breakthrough headache; may repeat x 1 after 2 hours; max 2 tabs per day or 8 per month  Meds ordered this encounter  Medications   propranolol (INDERAL) 20 MG tablet    Sig: Take 1 tablet (20 mg total) by mouth 2 (two) times daily.    Dispense:  60 tablet    Refill:  12   - To prevent or relieve headaches, try the following:   Cool Compress. Lie down and place a cool compress on your head.   Avoid headache triggers. If certain foods or odors seem to have triggered your migraines in the past, avoid them. A headache diary might help you identify triggers.   Include physical activity in your daily routine.   Manage stress. Find healthy ways to cope with the stressors, such as delegating tasks on your to-do list.   Practice relaxation techniques. Try deep breathing, yoga, massage and visualization.   Eat regularly. Eating regularly scheduled meals and maintaining a healthy diet might help prevent headaches. Also, drink plenty of fluids.   Follow a regular sleep schedule. Sleep deprivation might contribute to headaches  Consider biofeedback. With this mind-body technique, you learn to control certain bodily functions -- such as muscle tension, heart rate and blood pressure -- to prevent headaches or reduce headache pain.  Return in about 6 months (around 04/27/2019).    Penni Bombard, MD 5/63/8756, 4:33 PM Certified in Neurology, Neurophysiology and Neuroimaging  [facility] [address], [place], [place] [address] [phone]

## 2018-10-30 ENCOUNTER — Ambulatory Visit: Payer: Managed Care, Other (non HMO) | Admitting: Diagnostic Neuroimaging

## 2018-11-16 ENCOUNTER — Other Ambulatory Visit: Payer: Self-pay | Admitting: Family Medicine

## 2018-12-09 ENCOUNTER — Other Ambulatory Visit: Payer: Self-pay | Admitting: Family Medicine

## 2018-12-31 ENCOUNTER — Other Ambulatory Visit: Payer: Self-pay | Admitting: Family Medicine

## 2019-01-08 ENCOUNTER — Other Ambulatory Visit: Payer: Self-pay | Admitting: Internal Medicine

## 2019-01-08 ENCOUNTER — Other Ambulatory Visit: Payer: Managed Care, Other (non HMO)

## 2019-01-08 DIAGNOSIS — Z20822 Contact with and (suspected) exposure to covid-19: Secondary | ICD-10-CM

## 2019-01-13 LAB — NOVEL CORONAVIRUS, NAA: SARS-CoV-2, NAA: NOT DETECTED

## 2019-01-19 ENCOUNTER — Other Ambulatory Visit: Payer: Self-pay | Admitting: Family Medicine

## 2019-01-21 ENCOUNTER — Other Ambulatory Visit: Payer: Self-pay | Admitting: Family Medicine

## 2019-01-23 ENCOUNTER — Other Ambulatory Visit: Payer: Self-pay | Admitting: *Deleted

## 2019-01-23 MED ORDER — ROSUVASTATIN CALCIUM 10 MG PO TABS: 10.0000 mg | ORAL_TABLET | Freq: Every day | ORAL | 1 refills | Status: DC

## 2019-04-22 ENCOUNTER — Other Ambulatory Visit: Payer: Self-pay | Admitting: Family Medicine

## 2019-05-10 ENCOUNTER — Other Ambulatory Visit: Payer: Self-pay | Admitting: Family Medicine

## 2019-05-29 ENCOUNTER — Other Ambulatory Visit: Payer: Self-pay | Admitting: Family Medicine

## 2019-05-31 IMAGING — DX DG CHEST 2V
2 series · 2 of 2 positions shown · non-contrast
Comparison: 03/06/2014

CLINICAL DATA: Hypertension

EXAM:
CHEST  2 VIEW

[chest pa]
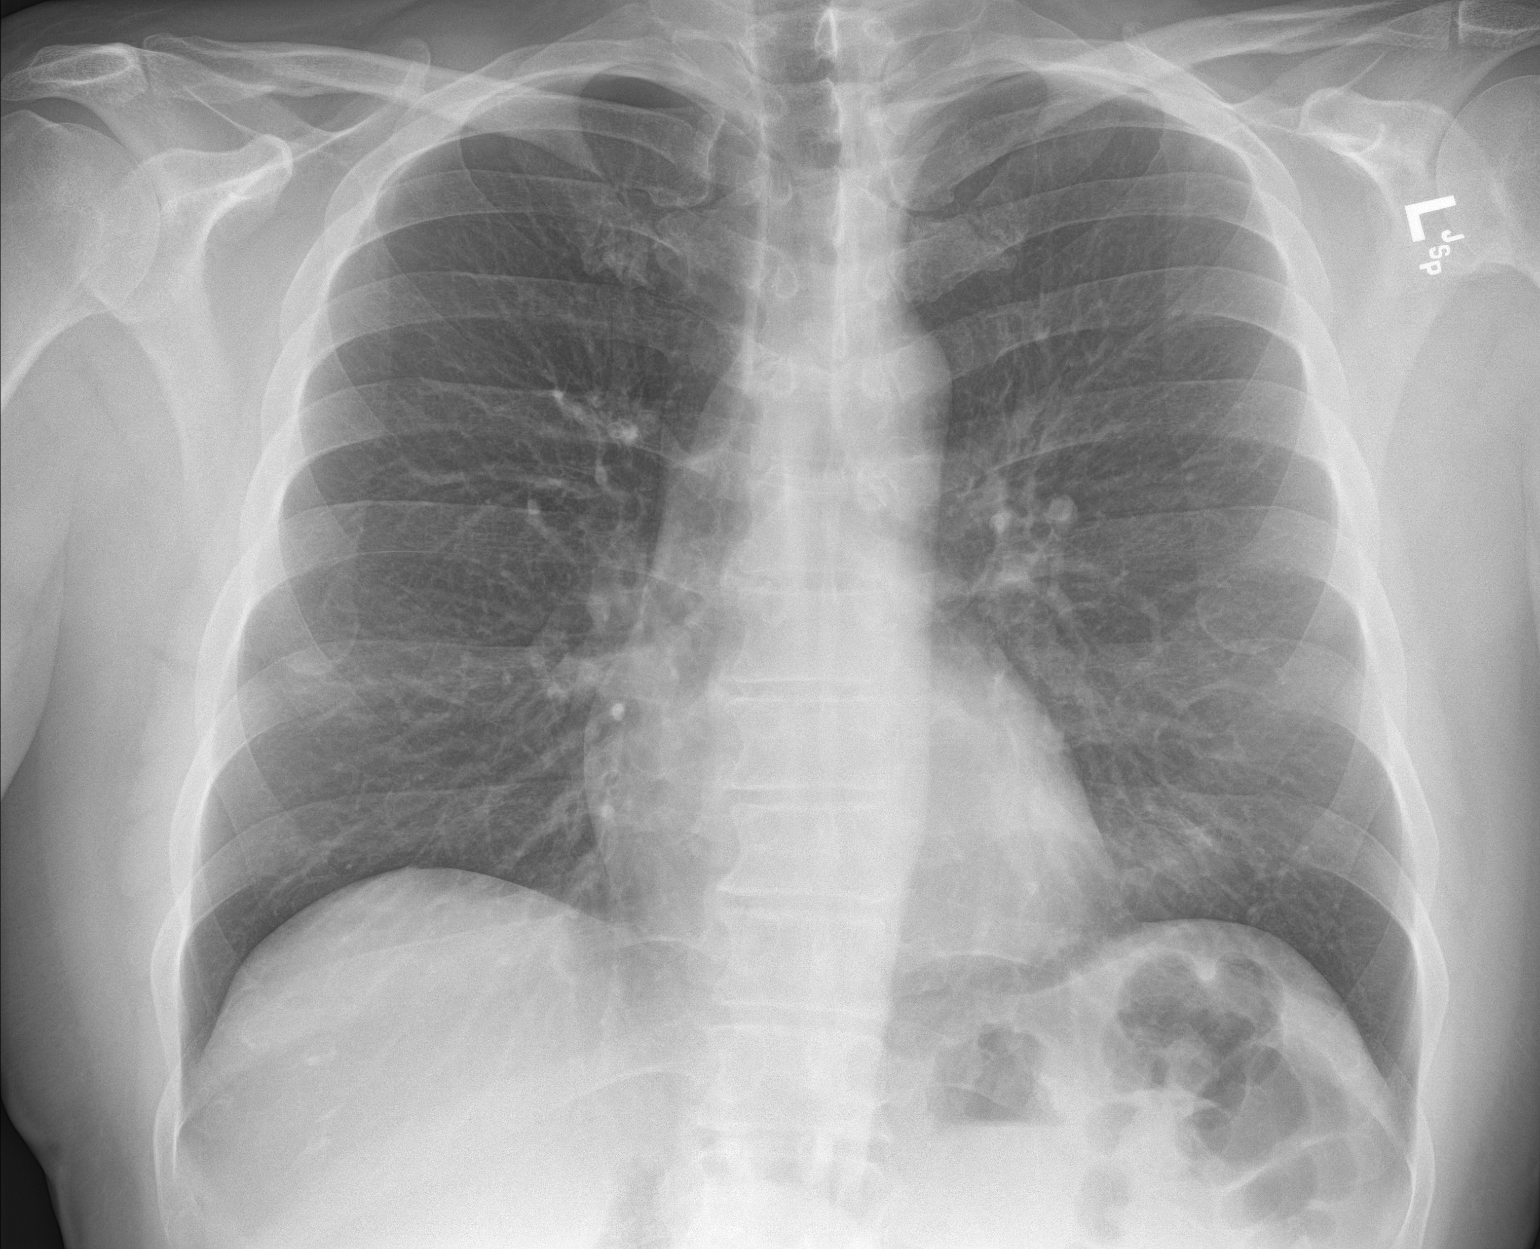

[chest lat]
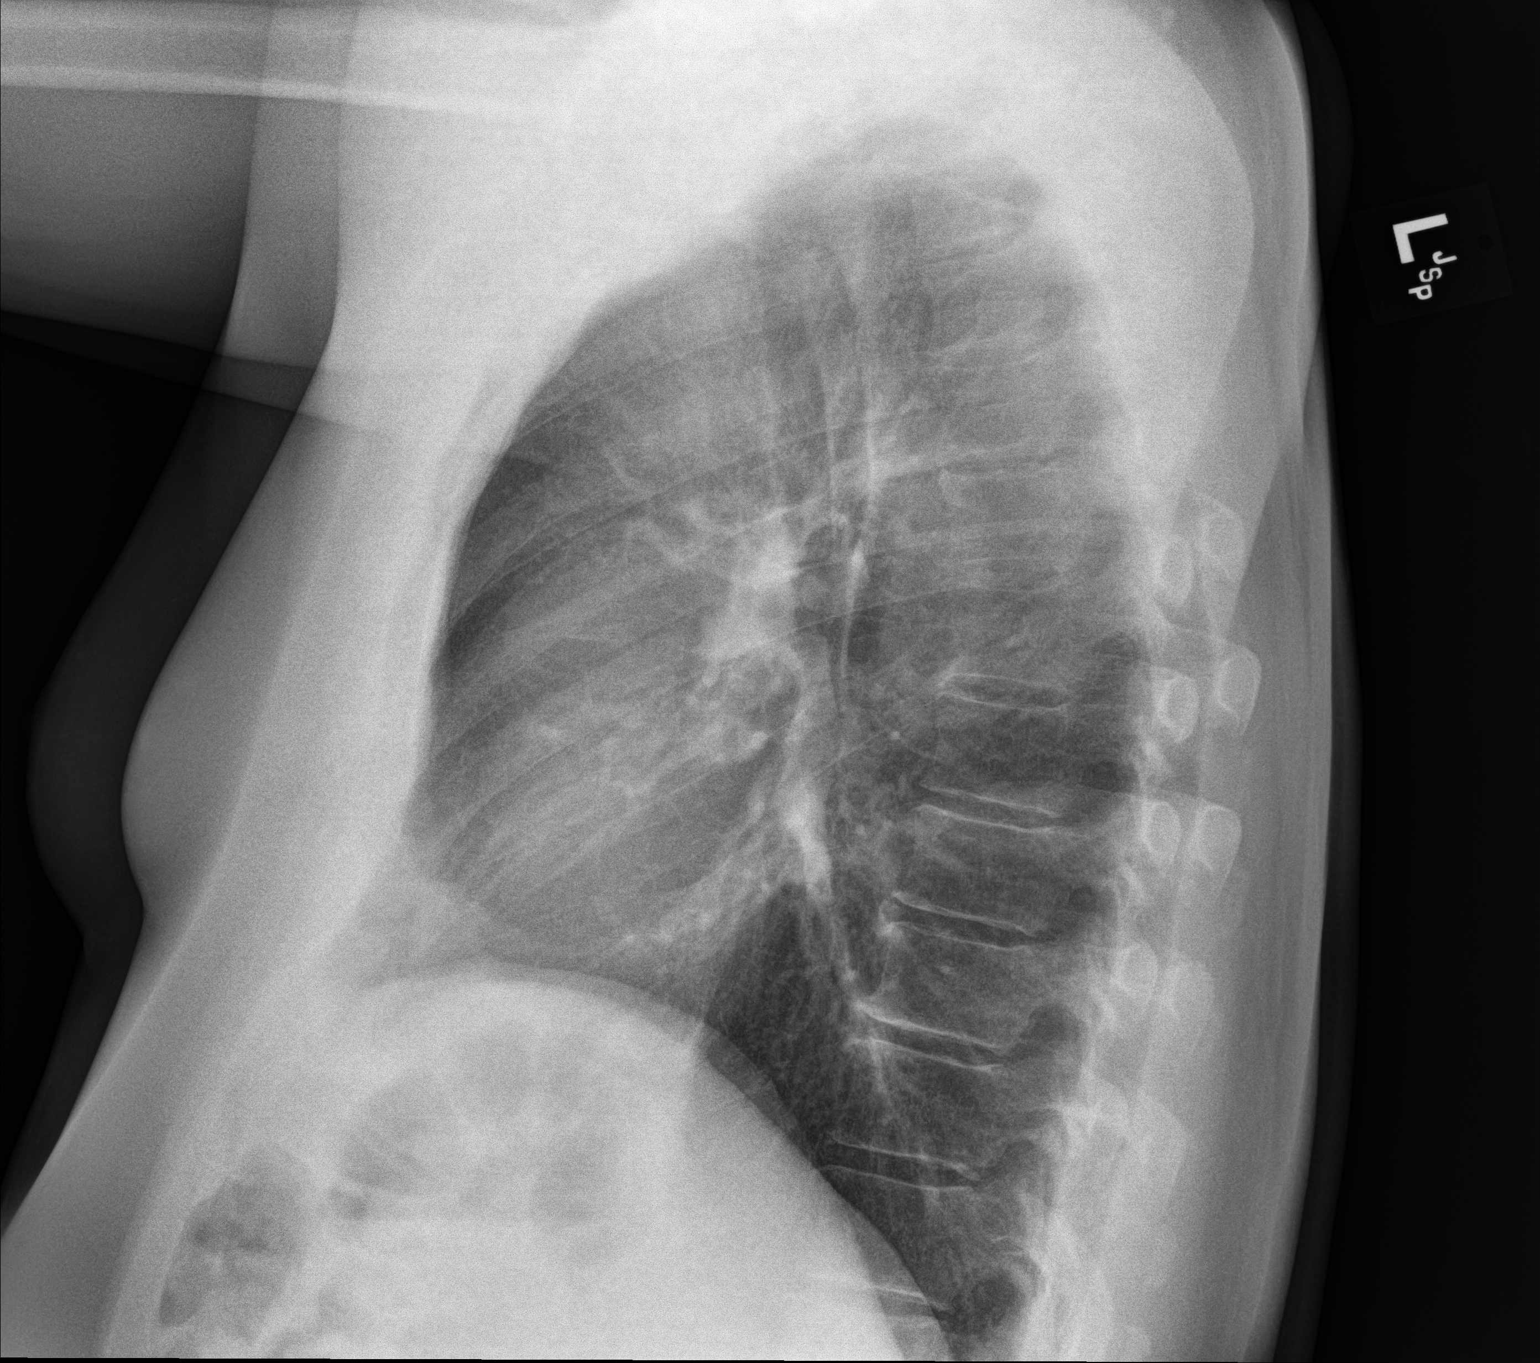

[2 of 2 positions shown; findings below may reference images not displayed]

FINDINGS: The heart size and mediastinal contours are within normal limits.
Both lungs are clear. The visualized skeletal structures are
unremarkable.
IMPRESSION: No active cardiopulmonary disease.

## 2019-06-03 ENCOUNTER — Other Ambulatory Visit: Payer: Self-pay | Admitting: Family Medicine

## 2019-06-04 NOTE — Telephone Encounter (Signed)
Apt scheduled.  

## 2019-06-04 NOTE — Telephone Encounter (Signed)
Dettinger. NTBS 30 days given 05/10/19

## 2019-06-22 ENCOUNTER — Other Ambulatory Visit: Payer: Self-pay | Admitting: Family Medicine

## 2019-06-28 ENCOUNTER — Encounter: Payer: Self-pay | Admitting: Family Medicine

## 2019-06-28 ENCOUNTER — Other Ambulatory Visit: Payer: Self-pay

## 2019-06-28 ENCOUNTER — Ambulatory Visit (INDEPENDENT_AMBULATORY_CARE_PROVIDER_SITE_OTHER): Payer: Managed Care, Other (non HMO) | Admitting: Family Medicine

## 2019-06-28 VITALS — BP 135/88 | HR 55 | Temp 97.1°F | Ht 67.0 in | Wt 213.4 lb

## 2019-06-28 DIAGNOSIS — E782 Mixed hyperlipidemia: Secondary | ICD-10-CM

## 2019-06-28 DIAGNOSIS — G4489 Other headache syndrome: Secondary | ICD-10-CM

## 2019-06-28 DIAGNOSIS — N400 Enlarged prostate without lower urinary tract symptoms: Secondary | ICD-10-CM

## 2019-06-28 DIAGNOSIS — K219 Gastro-esophageal reflux disease without esophagitis: Secondary | ICD-10-CM

## 2019-06-28 DIAGNOSIS — I10 Essential (primary) hypertension: Secondary | ICD-10-CM | POA: Diagnosis not present

## 2019-06-28 DIAGNOSIS — Z0001 Encounter for general adult medical examination with abnormal findings: Secondary | ICD-10-CM | POA: Diagnosis not present

## 2019-06-28 DIAGNOSIS — E559 Vitamin D deficiency, unspecified: Secondary | ICD-10-CM

## 2019-06-28 DIAGNOSIS — Z0000 Encounter for general adult medical examination without abnormal findings: Secondary | ICD-10-CM

## 2019-06-28 MED ORDER — ICOSAPENT ETHYL 1 G PO CAPS: 2.0000 g | ORAL_CAPSULE | Freq: Two times a day (BID) | ORAL | 3 refills | Status: DC

## 2019-06-28 MED ORDER — PANTOPRAZOLE SODIUM 40 MG PO TBEC: 40.0000 mg | DELAYED_RELEASE_TABLET | Freq: Every day | ORAL | 3 refills | Status: DC

## 2019-06-28 MED ORDER — ROSUVASTATIN CALCIUM 10 MG PO TABS: 10.0000 mg | ORAL_TABLET | Freq: Every day | ORAL | 1 refills | Status: DC

## 2019-06-28 MED ORDER — AMLODIPINE BESY-BENAZEPRIL HCL 5-40 MG PO CAPS: 1.0000 | ORAL_CAPSULE | Freq: Every day | ORAL | 3 refills | Status: DC

## 2019-06-28 MED ORDER — TOPIRAMATE 50 MG PO TABS: 50.0000 mg | ORAL_TABLET | Freq: Every day | ORAL | 3 refills | Status: DC

## 2019-06-28 MED ORDER — PROPRANOLOL HCL 20 MG PO TABS: 20.0000 mg | ORAL_TABLET | Freq: Two times a day (BID) | ORAL | 12 refills | Status: DC

## 2019-06-28 MED ORDER — VITAMIN D (ERGOCALCIFEROL) 1.25 MG (50000 UNIT) PO CAPS: 50000.0000 [IU] | ORAL_CAPSULE | ORAL | 0 refills | Status: DC

## 2019-06-28 NOTE — Progress Notes (Signed)
BP 135/88   Pulse (!) 55   Temp (!) 97.1 F (36.2 C) (Temporal)   Ht '5\' 7"'  (1.702 m)   Wt 213 lb 6.4 oz (96.8 kg)   SpO2 97%   BMI 33.42 kg/m    Subjective:   Patient ID: Frederick Barr, male    DOB: Jul 18, 1965, 53 y.o.   MRN: [id number]  HPI: Frederick Barr is a 53 y.o. male presenting on 06/28/2019 for Medical Management of Chronic Issues   HPI Adult well exam and physical recheck of chronic issues. Patient does get a headache syndrome, they are not severe but he says it is every day.  Patient takes Tylenol every day and has been evaluated by neurology on multiple occasions and is currently taking propranolol for. Patient denies any chest pain, shortness of breath, or vision issues, abdominal complaints, diarrhea, nausea, vomiting, or joint issues.   Hyperlipidemia Patient is coming in for recheck of his hyperlipidemia. The patient is currently taking Crestor. They deny any issues with myalgias or history of liver damage from it. They deny any focal numbness or weakness or chest pain.   Hypertension Patient is currently on amlodipine-benazepril and propranolol, and their blood pressure today is 135/88. Patient denies any lightheadedness or dizziness. Patient denies headaches, blurred vision, chest pains, shortness of breath, or weakness. Denies any side effects from medication and is content with current medication.   BPH Patient is coming in for recheck on BPH Symptoms: None Medication: None Last PSA: Unknown  Relevant past medical, surgical, family and social history reviewed and updated as indicated. Interim medical history since our last visit reviewed. Allergies and medications reviewed and updated.  Review of Systems  Constitutional: Negative for chills and fever.  Eyes: Negative for visual disturbance.  Respiratory: Negative for shortness of breath and wheezing.   Cardiovascular: Negative for chest pain and leg swelling.  Musculoskeletal: Negative for back pain and  gait problem.  Skin: Negative for rash.  Neurological: Negative for dizziness, weakness and light-headedness.  All other systems reviewed and are negative.   Per HPI unless specifically indicated above   Allergies as of 06/28/2019   No Known Allergies     Medication List       Accurate as of June 28, 2019 11:14 AM. If you have any questions, ask your nurse or doctor.        STOP taking these medications   hydrochlorothiazide 25 MG tablet Commonly known as: HYDRODIURIL Stopped by: Fransisca Kaufmann Miley Blanchett, MD     TAKE these medications   amLODipine-benazepril 5-40 MG capsule Commonly known as: LOTREL TAKE 1 CAPSULE BY MOUTH DAILY. (NEEDS TO BE SEEN BEFORE NEXT REFILL)   pantoprazole 40 MG tablet Commonly known as: PROTONIX TAKE 1 TABLET BY MOUTH EVERY DAY   propranolol 20 MG tablet Commonly known as: INDERAL Take 1 tablet (20 mg total) by mouth 2 (two) times daily.   rizatriptan 10 MG disintegrating tablet Commonly known as: MAXALT-MLT Take 1 tablet (10 mg total) by mouth as needed for migraine. May repeat in 2 hours if needed   rosuvastatin 10 MG tablet Commonly known as: CRESTOR Take 1 tablet (10 mg total) by mouth daily.   Vascepa 1 g capsule Generic drug: icosapent Ethyl TAKE 2 CAPSULES BY MOUTH TWICE A DAY   Vitamin D (Ergocalciferol) 1.25 MG (50000 UT) Caps capsule Commonly known as: DRISDOL TAKE 1 CAPSULE (50,000 UNITS TOTAL) BY MOUTH EVERY 7 (SEVEN) DAYS.        Objective:  BP 135/88   Pulse (!) 55   Temp (!) 97.1 F (36.2 C) (Temporal)   Ht '5\' 7"'  (1.702 m)   Wt 213 lb 6.4 oz (96.8 kg)   SpO2 97%   BMI 33.42 kg/m   Wt Readings from Last 3 Encounters:  06/28/19 213 lb 6.4 oz (96.8 kg)  06/27/18 209 lb (94.8 kg)  04/24/18 202 lb 12.8 oz (92 kg)    Physical Exam Vitals and nursing note reviewed.  Constitutional:      General: He is not in acute distress.    Appearance: He is well-developed. He is not diaphoretic.  Eyes:     General:  No scleral icterus.       Right eye: No discharge.     Conjunctiva/sclera: Conjunctivae normal.     Pupils: Pupils are equal, round, and reactive to light.  Neck:     Thyroid: No thyromegaly.  Cardiovascular:     Rate and Rhythm: Normal rate and regular rhythm.     Heart sounds: Normal heart sounds. No murmur.  Pulmonary:     Effort: Pulmonary effort is normal. No respiratory distress.     Breath sounds: Normal breath sounds. No wheezing.  Genitourinary:    Prostate: Enlarged. Not tender and no nodules present.     Rectum: Normal.  Musculoskeletal:        General: Normal range of motion.     Cervical back: Neck supple.  Lymphadenopathy:     Cervical: No cervical adenopathy.  Skin:    General: Skin is warm and dry.     Findings: No rash.  Neurological:     Mental Status: He is alert and oriented to person, place, and time.     Coordination: Coordination normal.  Psychiatric:        Behavior: Behavior normal.       Assessment & Plan:   Problem List Items Addressed This Visit      Cardiovascular and Mediastinum   Hypertension   Relevant Medications   amLODipine-benazepril (LOTREL) 5-40 MG capsule   propranolol (INDERAL) 20 MG tablet   icosapent Ethyl (VASCEPA) 1 g capsule   rosuvastatin (CRESTOR) 10 MG tablet   Other Relevant Orders   CMP14+EGFR     Digestive   GERD (gastroesophageal reflux disease)   Relevant Medications   pantoprazole (PROTONIX) 40 MG tablet   Other Relevant Orders   CBC with Differential/Platelet     Genitourinary   Benign prostatic hyperplasia without lower urinary tract symptoms   Relevant Orders   PSA, total and free     Other   Hyperlipidemia   Relevant Medications   amLODipine-benazepril (LOTREL) 5-40 MG capsule   propranolol (INDERAL) 20 MG tablet   icosapent Ethyl (VASCEPA) 1 g capsule   rosuvastatin (CRESTOR) 10 MG tablet   Other Relevant Orders   Lipid panel   Vitamin D deficiency   Relevant Medications   Vitamin D,  Ergocalciferol, (DRISDOL) 1.25 MG (50000 UT) CAPS capsule   Other Relevant Orders   Vitamin D 25 hydroxy    Other Visit Diagnoses    Well adult exam    -  Primary   Headache syndrome       Relevant Medications   propranolol (INDERAL) 20 MG tablet   topiramate (TOPAMAX) 50 MG tablet      Continue current medication, no changes except for we did add topiramate that he can try to call headache prevention.  Follow up plan: Return in about 1 year (around 06/27/2020),  or if symptoms worsen or fail to improve, for Physical.  Counseling provided for all of the vaccine components Orders Placed This Encounter  Procedures  . CBC with Differential/Platelet  . CMP14+EGFR  . Lipid panel  . Vitamin D 25 hydroxy  . PSA, total and free    Caryl Pina, MD [facility] 06/28/2019, 11:14 AM

## 2019-06-29 LAB — CBC WITH DIFFERENTIAL/PLATELET
Basophils Absolute: 0.1 10*3/uL (ref 0.0–0.2)
Basos: 1 %
EOS (ABSOLUTE): 0.2 10*3/uL (ref 0.0–0.4)
Eos: 2 %
Hematocrit: 47.2 % (ref 37.5–51.0)
Hemoglobin: 15.5 g/dL (ref 13.0–17.7)
Immature Grans (Abs): 0 10*3/uL (ref 0.0–0.1)
Immature Granulocytes: 0 %
Lymphocytes Absolute: 3.4 10*3/uL — ABNORMAL HIGH (ref 0.7–3.1)
Lymphs: 36 %
MCH: 27.8 pg (ref 26.6–33.0)
MCHC: 32.8 g/dL (ref 31.5–35.7)
MCV: 85 fL (ref 79–97)
Monocytes Absolute: 0.7 10*3/uL (ref 0.1–0.9)
Monocytes: 8 %
Neutrophils Absolute: 5.2 10*3/uL (ref 1.4–7.0)
Neutrophils: 53 %
Platelets: 270 10*3/uL (ref 150–450)
RBC: 5.58 x10E6/uL (ref 4.14–5.80)
RDW: 13.2 % (ref 11.6–15.4)
WBC: 9.6 10*3/uL (ref 3.4–10.8)

## 2019-06-29 LAB — PSA, TOTAL AND FREE
PSA, Free Pct: 44 %
PSA, Free: 0.22 ng/mL
Prostate Specific Ag, Serum: 0.5 ng/mL (ref 0.0–4.0)

## 2019-06-29 LAB — CMP14+EGFR
ALT: 25 IU/L (ref 0–44)
AST: 17 IU/L (ref 0–40)
Albumin/Globulin Ratio: 2.6 — ABNORMAL HIGH (ref 1.2–2.2)
Albumin: 4.7 g/dL (ref 3.8–4.9)
Alkaline Phosphatase: 74 IU/L (ref 39–117)
BUN/Creatinine Ratio: 11 (ref 9–20)
BUN: 10 mg/dL (ref 6–24)
Bilirubin Total: 0.4 mg/dL (ref 0.0–1.2)
CO2: 25 mmol/L (ref 20–29)
Calcium: 9.6 mg/dL (ref 8.7–10.2)
Chloride: 103 mmol/L (ref 96–106)
Creatinine, Ser: 0.94 mg/dL (ref 0.76–1.27)
GFR calc Af Amer: 105 mL/min/{1.73_m2} (ref >59)
GFR calc non Af Amer: 91 mL/min/{1.73_m2} (ref >59)
Globulin, Total: 1.8 g/dL (ref 1.5–4.5)
Glucose: 73 mg/dL (ref 65–99)
Potassium: 4.4 mmol/L (ref 3.5–5.2)
Sodium: 142 mmol/L (ref 134–144)
Total Protein: 6.5 g/dL (ref 6.0–8.5)

## 2019-06-29 LAB — LIPID PANEL
Chol/HDL Ratio: 3.8 ratio (ref 0.0–5.0)
Cholesterol, Total: 128 mg/dL (ref 100–199)
HDL: 34 mg/dL — ABNORMAL LOW (ref >39)
LDL Chol Calc (NIH): 66 mg/dL (ref 0–99)
Triglycerides: 160 mg/dL — ABNORMAL HIGH (ref 0–149)
VLDL Cholesterol Cal: 28 mg/dL (ref 5–40)

## 2019-06-29 LAB — VITAMIN D 25 HYDROXY (VIT D DEFICIENCY, FRACTURES): Vit D, 25-Hydroxy: 47.4 ng/mL (ref 30.0–100.0)

## 2019-09-13 ENCOUNTER — Other Ambulatory Visit: Payer: Self-pay | Admitting: Family Medicine

## 2019-09-13 DIAGNOSIS — E559 Vitamin D deficiency, unspecified: Secondary | ICD-10-CM

## 2019-12-04 ENCOUNTER — Other Ambulatory Visit: Payer: Self-pay | Admitting: Diagnostic Neuroimaging

## 2019-12-04 ENCOUNTER — Other Ambulatory Visit: Payer: Self-pay | Admitting: Family Medicine

## 2019-12-04 DIAGNOSIS — E782 Mixed hyperlipidemia: Secondary | ICD-10-CM

## 2019-12-04 NOTE — Telephone Encounter (Signed)
OV 06/28/19 RTC 1 yr

## 2020-02-22 ENCOUNTER — Other Ambulatory Visit: Payer: Self-pay | Admitting: Family Medicine

## 2020-02-22 DIAGNOSIS — E559 Vitamin D deficiency, unspecified: Secondary | ICD-10-CM

## 2020-05-27 ENCOUNTER — Ambulatory Visit (INDEPENDENT_AMBULATORY_CARE_PROVIDER_SITE_OTHER): Payer: Managed Care, Other (non HMO) | Admitting: Nurse Practitioner

## 2020-05-27 ENCOUNTER — Encounter: Payer: Self-pay | Admitting: Nurse Practitioner

## 2020-05-27 VITALS — BP 135/84 | HR 82 | Temp 98.0°F | Resp 20 | Ht 67.0 in | Wt 213.0 lb

## 2020-05-27 DIAGNOSIS — J0100 Acute maxillary sinusitis, unspecified: Secondary | ICD-10-CM | POA: Diagnosis not present

## 2020-05-27 DIAGNOSIS — K2900 Acute gastritis without bleeding: Secondary | ICD-10-CM | POA: Diagnosis not present

## 2020-05-27 MED ORDER — LANSOPRAZOLE 30 MG PO CPDR: 30.0000 mg | DELAYED_RELEASE_CAPSULE | Freq: Every day | ORAL | 2 refills | Status: DC

## 2020-05-27 MED ORDER — AMOXICILLIN-POT CLAVULANATE 875-125 MG PO TABS: 1.0000 | ORAL_TABLET | Freq: Two times a day (BID) | ORAL | 0 refills | Status: DC

## 2020-05-27 NOTE — Progress Notes (Signed)
Subjective:    Patient ID: Frederick Barr, male    DOB: 02-Jun-1966, 54 y.o.   MRN: [id number]  Chief Complaint: Sinusitis   HPI Patient come sin today c/o sinus congestion. He kept his grandson 3 weeks ago who had a common cold. He started getting sick shortly after that it and has progressed into productive cough and facial pressure. He deneis fever, and loss of taste or smell. He denies any known covid exposure and has not had the vaccines.   * he is also on protonix for gerd and he says last week he has developed a stabbing burning pain in back. He has been taking a lot of ibuprofen a dn wonders if has caused flare up. Review of Systems  Constitutional: Negative for chills, fatigue and fever.  HENT: Positive for congestion, sinus pressure and sinus pain. Negative for sore throat.   Respiratory: Positive for cough (productive- yellowish). Negative for shortness of breath.   Cardiovascular: Negative for chest pain.  Neurological: Negative for headaches.  All other systems reviewed and are negative.      Objective:   Physical Exam Vitals and nursing note reviewed.  Constitutional:      Appearance: Normal appearance.  HENT:     Head: Normocephalic.     Comments: Maxillary sinus pressure bil.    Right Ear: Tympanic membrane normal.     Left Ear: Tympanic membrane normal.     Nose:     Comments: Erythematous and boggy nasal mucosa    Mouth/Throat:     Mouth: Mucous membranes are moist.     Pharynx: Oropharynx is clear.  Cardiovascular:     Rate and Rhythm: Normal rate and regular rhythm.     Heart sounds: Normal heart sounds.  Pulmonary:     Effort: Pulmonary effort is normal.     Breath sounds: Normal breath sounds.  Abdominal:     General: Abdomen is flat. Bowel sounds are normal. There is no distension.     Palpations: Abdomen is soft.     Tenderness: There is no abdominal tenderness. There is no guarding or rebound.  Musculoskeletal:     Cervical back: Normal range of  motion.  Skin:    General: Skin is warm.  Neurological:     General: No focal deficit present.     Mental Status: He is alert.     BP 135/84   Pulse 82   Temp 98 F (36.7 C) (Temporal)   Resp 20   Ht 5\' 7"  (1.702 m)   Wt 213 lb (96.6 kg)   SpO2 97%   BMI 33.36 kg/m        Assessment & Plan:  Frederick Barr in today with chief complaint of Sinusitis   1. Acute non-recurrent maxillary sinusitis 1. Take meds as prescribed 2. Use a cool mist humidifier especially during the winter months and when heat has been humid. 3. Use saline nose sprays frequently 4. Saline irrigations of the nose can be very helpful if done frequently.  * 4X daily for 1 week*  * Use of a nettie pot can be helpful with this. Follow directions with this* 5. Drink plenty of fluids 6. Keep thermostat turn down low 7.For any cough or congestion  Use plain Mucinex- regular strength or max strength is fine   * Children- consult with Pharmacist for dosing 8. For fever or aces or pains- take tylenol or ibuprofen appropriate for age and weight.  * for fevers greater than 101  orally you may alternate ibuprofen and tylenol every  3 hours.    - amoxicillin-clavulanate (AUGMENTIN) 875-125 MG tablet; Take 1 tablet by mouth 2 (two) times daily.  Dispense: 14 tablet; Refill: 0  2. Acute gastritis without hemorrhage, unspecified gastritis type stop protonix Avoid spicy foods Do not eat 2 hours prior to bedtime - lansoprazole (PREVACID) 30 MG capsule; Take 1 capsule (30 mg total) by mouth daily at 12 noon.  Dispense: 30 capsule; Refill: 2    The above assessment and management plan was discussed with the patient. The patient verbalized understanding of and has agreed to the management plan. Patient is aware to call the clinic if symptoms persist or worsen. Patient is aware when to return to the clinic for a follow-up visit. Patient educated on when it is appropriate to go to the emergency department.    Mary-Margaret Hassell Done, FNP

## 2020-05-27 NOTE — Patient Instructions (Signed)
Gastritis, Adult  Gastritis is swelling (inflammation) of the stomach. Gastritis can develop quickly (acute). It can also develop slowly over time (chronic). It is important to get help for this condition. If you do not get help, your stomach can bleed, and you can get sores (ulcers) in your stomach. What are the causes? This condition may be caused by:  Germs that get to your stomach.  Drinking too much alcohol.  Medicines you are taking.  Too much acid in the stomach.  A disease of the intestines or stomach.  Stress.  An allergic reaction.  Crohn's disease.  Some cancer treatments (radiation). Sometimes the cause of this condition is not known. What are the signs or symptoms? Symptoms of this condition include:  Pain in your stomach.  A burning feeling in your stomach.  Feeling sick to your stomach (nauseous).  Throwing up (vomiting).  Feeling too full after you eat.  Weight loss.  Bad breath.  Throwing up blood.  Blood in your poop (stool). How is this diagnosed? This condition may be diagnosed with:  Your medical history and symptoms.  A physical exam.  Tests. These can include: ? Blood tests. ? Stool tests. ? A procedure to look inside your stomach (upper endoscopy). ? A test in which a sample of tissue is taken for testing (biopsy). How is this treated? Treatment for this condition depends on what caused it. You may be given:  Antibiotic medicine, if your condition was caused by germs.  H2 blockers and similar medicines, if your condition was caused by too much acid. Follow these instructions at home: Medicines  Take over-the-counter and prescription medicines only as told by your doctor.  If you were prescribed an antibiotic medicine, take it as told by your doctor. Do not stop taking it even if you start to feel better. Eating and drinking   Eat small meals often, instead of large meals.  Avoid foods and drinks that make your symptoms  worse.  Drink enough fluid to keep your pee (urine) pale yellow. Alcohol use  Do not drink alcohol if: ? Your doctor tells you not to drink. ? You are pregnant, may be pregnant, or are planning to become pregnant.  If you drink alcohol: ? Limit your use to:  0-1 drink a day for women.  0-2 drinks a day for men. ? Be aware of how much alcohol is in your drink. In the [place] one drink equals one 12 oz bottle of beer (355 mL), one 5 oz glass of wine (148 mL), or one 1 oz glass of hard liquor (44 mL). General instructions  Talk with your doctor about ways to manage stress. You can exercise or do deep breathing, meditation, or yoga.  Do not smoke or use products that have nicotine or tobacco. If you need help quitting, ask your doctor.  Keep all follow-up visits as told by your doctor. This is important. Contact a doctor if:  Your symptoms get worse.  Your symptoms go away and then come back. Get help right away if:  You throw up blood or something that looks like coffee grounds.  You have black or dark red poop.  You throw up any time you try to drink fluids.  Your stomach pain gets worse.  You have a fever.  You do not feel better after one week. Summary  Gastritis is swelling (inflammation) of the stomach.  You must get help for this condition. If you do not get help, your stomach   can bleed, and you can get sores (ulcers).  This condition is diagnosed with medical history, physical exam, or tests.  You can be treated with medicines for germs or medicines to block too much acid in your stomach. This information is not intended to replace advice given to you by your health care provider. Make sure you discuss any questions you have with your health care provider. Document Revised: 11/15/2017 Document Reviewed: 11/15/2017 Elsevier Patient Education  2020 [employer] Inc[employer]  

## 2020-06-12 ENCOUNTER — Other Ambulatory Visit: Payer: Self-pay | Admitting: Family Medicine

## 2020-06-12 DIAGNOSIS — E782 Mixed hyperlipidemia: Secondary | ICD-10-CM

## 2020-07-04 ENCOUNTER — Other Ambulatory Visit: Payer: Self-pay | Admitting: Family Medicine

## 2020-07-04 DIAGNOSIS — E782 Mixed hyperlipidemia: Secondary | ICD-10-CM

## 2020-07-07 NOTE — Telephone Encounter (Signed)
[place]. NTBS 30 days given 06/12/20 Last OV 06/18/19

## 2020-07-08 ENCOUNTER — Other Ambulatory Visit: Payer: Self-pay | Admitting: Family Medicine

## 2020-07-08 DIAGNOSIS — G4489 Other headache syndrome: Secondary | ICD-10-CM

## 2020-07-08 DIAGNOSIS — I10 Essential (primary) hypertension: Secondary | ICD-10-CM

## 2020-07-09 ENCOUNTER — Other Ambulatory Visit: Payer: Self-pay | Admitting: Family Medicine

## 2020-07-09 DIAGNOSIS — E782 Mixed hyperlipidemia: Secondary | ICD-10-CM

## 2020-07-09 DIAGNOSIS — K219 Gastro-esophageal reflux disease without esophagitis: Secondary | ICD-10-CM

## 2020-08-04 ENCOUNTER — Other Ambulatory Visit: Payer: Self-pay | Admitting: Nurse Practitioner

## 2020-08-04 ENCOUNTER — Other Ambulatory Visit: Payer: Self-pay | Admitting: Family Medicine

## 2020-08-04 DIAGNOSIS — K2900 Acute gastritis without bleeding: Secondary | ICD-10-CM

## 2020-08-04 DIAGNOSIS — E559 Vitamin D deficiency, unspecified: Secondary | ICD-10-CM

## 2020-08-04 DIAGNOSIS — E782 Mixed hyperlipidemia: Secondary | ICD-10-CM

## 2020-08-04 DIAGNOSIS — G4489 Other headache syndrome: Secondary | ICD-10-CM

## 2020-08-04 DIAGNOSIS — K219 Gastro-esophageal reflux disease without esophagitis: Secondary | ICD-10-CM

## 2020-08-04 NOTE — Telephone Encounter (Signed)
CALLED PATIENT, NO ANSWER, LEFT MESSAGE TO RETURN CALL 

## 2020-08-04 NOTE — Telephone Encounter (Signed)
Last OV 06/28/19-no future appts scheduled 30 day supply sent in  ntbs for further refills

## 2020-08-22 ENCOUNTER — Encounter: Payer: Self-pay | Admitting: Family Medicine

## 2020-08-22 ENCOUNTER — Ambulatory Visit (INDEPENDENT_AMBULATORY_CARE_PROVIDER_SITE_OTHER): Payer: Managed Care, Other (non HMO) | Admitting: Family Medicine

## 2020-08-22 VITALS — Ht 67.0 in

## 2020-08-22 DIAGNOSIS — E782 Mixed hyperlipidemia: Secondary | ICD-10-CM

## 2020-08-22 DIAGNOSIS — K2900 Acute gastritis without bleeding: Secondary | ICD-10-CM | POA: Diagnosis not present

## 2020-08-22 DIAGNOSIS — K219 Gastro-esophageal reflux disease without esophagitis: Secondary | ICD-10-CM | POA: Diagnosis not present

## 2020-08-22 DIAGNOSIS — I10 Essential (primary) hypertension: Secondary | ICD-10-CM

## 2020-08-22 DIAGNOSIS — J0110 Acute frontal sinusitis, unspecified: Secondary | ICD-10-CM

## 2020-08-22 DIAGNOSIS — G4489 Other headache syndrome: Secondary | ICD-10-CM

## 2020-08-22 DIAGNOSIS — G4700 Insomnia, unspecified: Secondary | ICD-10-CM

## 2020-08-22 MED ORDER — ICOSAPENT ETHYL 1 G PO CAPS: 2.0000 g | ORAL_CAPSULE | Freq: Two times a day (BID) | ORAL | 3 refills | Status: DC

## 2020-08-22 MED ORDER — LANSOPRAZOLE 30 MG PO CPDR: 30.0000 mg | DELAYED_RELEASE_CAPSULE | Freq: Every day | ORAL | 3 refills | Status: DC

## 2020-08-22 MED ORDER — ROSUVASTATIN CALCIUM 10 MG PO TABS: 10.0000 mg | ORAL_TABLET | Freq: Every day | ORAL | 3 refills | Status: DC

## 2020-08-22 MED ORDER — PROPRANOLOL HCL 20 MG PO TABS: 20.0000 mg | ORAL_TABLET | Freq: Two times a day (BID) | ORAL | 3 refills | Status: DC

## 2020-08-22 MED ORDER — AMOXICILLIN-POT CLAVULANATE 875-125 MG PO TABS
1.0000 | ORAL_TABLET | Freq: Two times a day (BID) | ORAL | 0 refills | Status: DC
Start: 2020-08-22 — End: 2021-07-02

## 2020-08-22 MED ORDER — AMLODIPINE BESY-BENAZEPRIL HCL 5-40 MG PO CAPS: 1.0000 | ORAL_CAPSULE | Freq: Every day | ORAL | 3 refills | Status: DC

## 2020-08-22 MED ORDER — AMITRIPTYLINE HCL 25 MG PO TABS
25.0000 mg | ORAL_TABLET | Freq: Every day | ORAL | 2 refills | Status: DC
Start: 2020-08-22 — End: 2020-09-15

## 2020-08-22 NOTE — Progress Notes (Signed)
Virtual Visit via telephone Note  I connected with Frederick Barr on 08/22/20 at 1616 by telephone and verified that I am speaking with the correct person using two identifiers. Frederick Barr is currently located at home and patient are currently with her during visit. The provider, Fransisca Kaufmann Ella Golomb, MD is located in their office at time of visit.  Call ended at 1635  I discussed the limitations, risks, security and privacy concerns of performing an evaluation and management service by telephone and the availability of in person appointments. I also discussed with the patient that there may be a patient responsible charge related to this service. The patient expressed understanding and agreed to proceed.   History and Present Illness: Hypertension Patient is currently on amlodipine-benazepril and propranolol, and their blood pressure today is 140/85. Patient denies any lightheadedness or dizziness. Patient denies headaches, blurred vision, chest pains, shortness of breath, or weakness. Denies any side effects from medication and is content with current medication.   Hyperlipidemia Patient is coming in for recheck of his hyperlipidemia. The patient is currently taking Crestor and vascepa. They deny any issues with myalgias or history of liver damage from it. They deny any focal numbness or weakness or chest pain.   GERD Patient is currently on lansoprozole.  She denies any major symptoms or abdominal pain or belching or burping. She denies any blood in her stool or lightheadedness or dizziness.   Persistent daily headaches Saw neurology for daily headaches and takes tylenol and saves rizatriptan for bad days.  Bad days are not every month. His headaches can come up any time of day.  They are mostly in frontal region.  They feel like tightness or squeezing. No visual deficits. Had [employer].  Patient is not sleeping well at night because of stress at work and is affecting his  sleep.  He used [place]  Patient is having cough and congestion and was around grandkids over the weekend.  He started with sore throat and has developed into sinus blockage.  He denies fevers or chills or body aches.  He did a home test negative.  He denies loss of taste or smell.   No diagnosis found.  Outpatient Encounter Medications as of 08/22/2020  Medication Sig  . amLODipine-benazepril (LOTREL) 5-40 MG capsule TAKE 1 CAPSULE BY MOUTH EVERY DAY  . amoxicillin-clavulanate (AUGMENTIN) 875-125 MG tablet Take 1 tablet by mouth 2 (two) times daily.  . lansoprazole (PREVACID) 30 MG capsule TAKE 1 CAPSULE (30 MG TOTAL) BY MOUTH DAILY AT 12 NOON.  . pantoprazole (PROTONIX) 40 MG tablet TAKE 1 TABLET BY MOUTH EVERY DAY  . propranolol (INDERAL) 20 MG tablet TAKE 1 TABLET BY MOUTH TWICE A DAY  . rizatriptan (MAXALT-MLT) 10 MG disintegrating tablet Take 1 tablet (10 mg total) by mouth as needed for migraine. May repeat in 2 hours if needed  . rosuvastatin (CRESTOR) 10 MG tablet Take 1 tablet (10 mg total) by mouth daily.  Marland Kitchen topiramate (TOPAMAX) 50 MG tablet TAKE 1 TABLET BY MOUTH EVERYDAY AT BEDTIME  . VASCEPA 1 g capsule TAKE 2 CAPSULES (2 G TOTAL) BY MOUTH 2 (TWO) TIMES DAILY.  Marland Kitchen Vitamin D, Ergocalciferol, (DRISDOL) 1.25 MG (50000 UNIT) CAPS capsule TAKE 1 CAPSULE (50,000 UNITS TOTAL) BY MOUTH EVERY 7 (SEVEN) DAYS.   No facility-administered encounter medications on file as of 08/22/2020.    Review of Systems  Constitutional: Negative for chills and fever.  HENT: Positive for congestion, postnasal drip, rhinorrhea, sinus pressure  and sore throat. Negative for ear discharge, ear pain, sneezing and voice change.   Eyes: Negative for pain, discharge, redness and visual disturbance.  Respiratory: Positive for cough. Negative for shortness of breath and wheezing.   Cardiovascular: Negative for chest pain and leg swelling.  Musculoskeletal: Negative for back pain and gait problem.  Skin: Negative  for rash.  Neurological: Positive for headaches. Negative for dizziness, weakness and light-headedness.  Psychiatric/Behavioral: Positive for sleep disturbance. Negative for decreased concentration, dysphoric mood, self-injury and suicidal ideas. The patient is nervous/anxious.   All other systems reviewed and are negative.   Observations/Objective: Patient sounds comfortable and in no acute distress  Assessment and Plan: Problem List Items Addressed This Visit      Cardiovascular and Mediastinum   Hypertension - Primary   Relevant Medications   amLODipine-benazepril (LOTREL) 5-40 MG capsule   propranolol (INDERAL) 20 MG tablet   rosuvastatin (CRESTOR) 10 MG tablet   icosapent Ethyl (VASCEPA) 1 g capsule     Digestive   GERD (gastroesophageal reflux disease)   Relevant Medications   lansoprazole (PREVACID) 30 MG capsule     Other   Hyperlipidemia   Relevant Medications   amLODipine-benazepril (LOTREL) 5-40 MG capsule   propranolol (INDERAL) 20 MG tablet   rosuvastatin (CRESTOR) 10 MG tablet   icosapent Ethyl (VASCEPA) 1 g capsule    Other Visit Diagnoses    Essential hypertension       Relevant Medications   amLODipine-benazepril (LOTREL) 5-40 MG capsule   propranolol (INDERAL) 20 MG tablet   rosuvastatin (CRESTOR) 10 MG tablet   icosapent Ethyl (VASCEPA) 1 g capsule   Acute gastritis without hemorrhage, unspecified gastritis type       Relevant Medications   lansoprazole (PREVACID) 30 MG capsule   Headache syndrome       Relevant Medications   propranolol (INDERAL) 20 MG tablet   amitriptyline (ELAVIL) 25 MG tablet   Acute non-recurrent frontal sinusitis       Relevant Medications   amoxicillin-clavulanate (AUGMENTIN) 875-125 MG tablet   Insomnia, unspecified type          Will try amitriptyline to help with insomnia and headache syndrome and stress and anxiety that he has from work.  Patient feels like his gastritis is very much under control with Prevacid now  doing very well. We could not do blood work today because of Covid symptoms but will have him come back in a couple months do the blood work when he returns.  Sent refills for all of his medicines.  Follow up plan: Return in about 2 months (around 10/20/2020), or if symptoms worsen or fail to improve, for insomnia and headache follow-up.     I discussed the assessment and treatment plan with the patient. The patient was provided an opportunity to ask questions and all were answered. The patient agreed with the plan and demonstrated an understanding of the instructions.   The patient was advised to call back or seek an in-person evaluation if the symptoms worsen or if the condition fails to improve as anticipated.  The above assessment and management plan was discussed with the patient. The patient verbalized understanding of and has agreed to the management plan. Patient is aware to call the clinic if symptoms persist or worsen. Patient is aware when to return to the clinic for a follow-up visit. Patient educated on when it is appropriate to go to the emergency department.    I provided 19 minutes of non-face-to-face time  during this encounter.    Worthy Rancher, MD

## 2020-09-14 ENCOUNTER — Other Ambulatory Visit: Payer: Self-pay | Admitting: Family Medicine

## 2020-09-14 DIAGNOSIS — G4489 Other headache syndrome: Secondary | ICD-10-CM

## 2020-12-12 ENCOUNTER — Other Ambulatory Visit: Payer: Self-pay | Admitting: Family Medicine

## 2020-12-12 DIAGNOSIS — G4489 Other headache syndrome: Secondary | ICD-10-CM

## 2021-01-09 ENCOUNTER — Other Ambulatory Visit: Payer: Self-pay | Admitting: Family Medicine

## 2021-01-09 DIAGNOSIS — G4489 Other headache syndrome: Secondary | ICD-10-CM

## 2021-01-13 ENCOUNTER — Other Ambulatory Visit: Payer: Self-pay | Admitting: Family Medicine

## 2021-01-13 DIAGNOSIS — G4489 Other headache syndrome: Secondary | ICD-10-CM

## 2021-02-22 ENCOUNTER — Encounter: Payer: Self-pay | Admitting: Internal Medicine

## 2021-03-21 ENCOUNTER — Other Ambulatory Visit: Payer: Self-pay | Admitting: Family Medicine

## 2021-03-21 DIAGNOSIS — G4489 Other headache syndrome: Secondary | ICD-10-CM

## 2021-05-08 ENCOUNTER — Encounter: Payer: Self-pay | Admitting: Internal Medicine

## 2021-07-02 ENCOUNTER — Other Ambulatory Visit: Payer: Self-pay

## 2021-07-02 ENCOUNTER — Ambulatory Visit (AMBULATORY_SURGERY_CENTER): Payer: Managed Care, Other (non HMO) | Admitting: *Deleted

## 2021-07-02 VITALS — Ht 67.0 in | Wt 215.0 lb

## 2021-07-02 DIAGNOSIS — Z86010 Personal history of colonic polyps: Secondary | ICD-10-CM

## 2021-07-02 MED ORDER — NA SULFATE-K SULFATE-MG SULF 17.5-3.13-1.6 GM/177ML PO SOLN: 1.0000 | ORAL | 0 refills | Status: DC

## 2021-07-02 NOTE — Progress Notes (Signed)
Patient's pre-visit was done today over the phone with the patient. Name,DOB and address verified. Patient denies any allergies to Eggs and Soy. Patient denies any problems with anesthesia/sedation. Patient is not taking any diet pills or blood thinners. No home Oxygen. Packet of Prep instructions mailed to patient including a copy of a consent form-pt is aware. Prep instructions sent to pt's MyChart (if activated).Patient understands to call us back with any questions or concerns. Patient is aware of our care-partner policy and EKBTC-48 safety protocol.   EMMI education assigned to the patient for the procedure, sent to [place].

## 2021-07-05 ENCOUNTER — Other Ambulatory Visit: Payer: Self-pay | Admitting: Family Medicine

## 2021-07-05 DIAGNOSIS — G4489 Other headache syndrome: Secondary | ICD-10-CM

## 2021-07-10 ENCOUNTER — Telehealth: Payer: Self-pay | Admitting: Internal Medicine

## 2021-07-10 NOTE — Telephone Encounter (Signed)
Inbound call from patient states insurance does not cover suprep

## 2021-07-10 NOTE — Telephone Encounter (Signed)
Per [facility]- the generic is covered per [facility]-  pt made aware

## 2021-07-15 ENCOUNTER — Encounter: Payer: Self-pay | Admitting: Internal Medicine

## 2021-07-16 ENCOUNTER — Ambulatory Visit (AMBULATORY_SURGERY_CENTER): Payer: Managed Care, Other (non HMO) | Admitting: Internal Medicine

## 2021-07-16 ENCOUNTER — Other Ambulatory Visit: Payer: Self-pay

## 2021-07-16 ENCOUNTER — Encounter: Payer: Self-pay | Admitting: Internal Medicine

## 2021-07-16 VITALS — BP 117/83 | HR 57 | Temp 97.4°F | Resp 19 | Ht 67.0 in | Wt 215.0 lb

## 2021-07-16 DIAGNOSIS — Z86010 Personal history of colonic polyps: Secondary | ICD-10-CM | POA: Diagnosis not present

## 2021-07-16 DIAGNOSIS — D124 Benign neoplasm of descending colon: Secondary | ICD-10-CM | POA: Diagnosis not present

## 2021-07-16 MED ORDER — SODIUM CHLORIDE 0.9 % IV SOLN: 500.0000 mL | Freq: Once | INTRAVENOUS | Status: DC

## 2021-07-16 NOTE — Op Note (Signed)
[facility] Patient Name: Frederick Barr Procedure Date: 07/16/2021 9:12 AM MRN: [id number] Endoscopist: Jerene Bears , MD Age: 56 Referring MD:  Date of Birth: Oct 23, 1965 Gender: Male Account #: [id number] Procedure:                Colonoscopy Indications:              High risk colon cancer surveillance: Personal                            history of multiple adenomas (2015 and 2019), Last                            colonoscopy: March 2019 Medicines:                Monitored Anesthesia Care Procedure:                Pre-Anesthesia Assessment:                           - Prior to the procedure, a History and Physical                            was performed, and patient medications and                            allergies were reviewed. The patient's tolerance of                            previous anesthesia was also reviewed. The risks                            and benefits of the procedure and the sedation                            options and risks were discussed with the patient.                            All questions were answered, and informed consent                            was obtained. Prior Anticoagulants: The patient has                            taken no previous anticoagulant or antiplatelet                            agents. ASA Grade Assessment: II - A patient with                            mild systemic disease. After reviewing the risks                            and benefits, the patient was deemed in  satisfactory condition to undergo the procedure.                           After obtaining informed consent, the colonoscope                            was passed under direct vision. Throughout the                            procedure, the patient's blood pressure, pulse, and                            oxygen saturations were monitored continuously. The                            CF [id number] #[id number] was introduced through the  anus                            and advanced to the cecum, identified by                            appendiceal orifice and ileocecal valve. The                            colonoscopy was performed without difficulty. The                            patient tolerated the procedure well. The quality                            of the bowel preparation was good. The ileocecal                            valve, appendiceal orifice, and rectum were                            photographed. Scope In: 9:15:33 AM Scope Out: 9:27:09 AM Scope Withdrawal Time: 0 hours 10 minutes 20 seconds  Total Procedure Duration: 0 hours 11 minutes 36 seconds  Findings:                 The perianal and digital rectal examinations were                            normal.                           A 3 mm polyp was found in the descending colon. The                            polyp was sessile. The polyp was removed with a                            cold snare. Resection and retrieval were complete.  External hemorrhoids were found during                            retroflexion. The hemorrhoids were small.                           The exam was otherwise without abnormality. Complications:            No immediate complications. Estimated Blood Loss:     Estimated blood loss was minimal. Impression:               - One 3 mm polyp in the descending colon, removed                            with a cold snare. Resected and retrieved.                           - Small external hemorrhoids.                           - The examination was otherwise normal. Recommendation:           - Patient has a contact number available for                            emergencies. The signs and symptoms of potential                            delayed complications were discussed with the                            patient. Return to normal activities tomorrow.                            Written discharge  instructions were provided to the                            patient.                           - Resume previous diet.                           - Continue present medications.                           - Await pathology results.                           - Repeat colonoscopy in 5 years for surveillance. Jerene Bears, MD 07/16/2021 9:29:59 AM This report has been signed electronically.

## 2021-07-16 NOTE — Progress Notes (Signed)
Vitals-CW  Pt's states no medical or surgical changes since previsit or office visit. 

## 2021-07-16 NOTE — Progress Notes (Signed)
Called to room to assist during endoscopic procedure.  Patient ID and intended procedure confirmed with present staff. Received instructions for my participation in the procedure from the performing physician.  

## 2021-07-16 NOTE — Patient Instructions (Addendum)
Handouts were given to your care partner on polyps and hemorrhoids. You may resume your current medications today. Await biopsy results.  May take 1-3 weeks to receive pathology results. Repeat colonoscopy in 5 years. Please call if any questions or concerns.       YOU HAD AN ENDOSCOPIC PROCEDURE TODAY AT [facility]:   Refer to the procedure report that was given to you for any specific questions about what was found during the examination.  If the procedure report does not answer your questions, please call your gastroenterologist to clarify.  If you requested that your care partner not be given the details of your procedure findings, then the procedure report has been included in a sealed envelope for you to review at your convenience later.  YOU SHOULD EXPECT: Some feelings of bloating in the abdomen. Passage of more gas than usual.  Walking can help get rid of the air that was put into your GI tract during the procedure and reduce the bloating. If you had a lower endoscopy (such as a colonoscopy or flexible sigmoidoscopy) you may notice spotting of blood in your stool or on the toilet paper. If you underwent a bowel prep for your procedure, you may not have a normal bowel movement for a few days.  Please Note:  You might notice some irritation and congestion in your nose or some drainage.  This is from the oxygen used during your procedure.  There is no need for concern and it should clear up in a day or so.  SYMPTOMS TO REPORT IMMEDIATELY:  Following lower endoscopy (colonoscopy or flexible sigmoidoscopy):  Excessive amounts of blood in the stool  Significant tenderness or worsening of abdominal pains  Swelling of the abdomen that is new, acute  Fever of 100F or higher   For urgent or emergent issues, a gastroenterologist can be reached at any hour by calling [phone]. Do not use MyChart messaging for urgent concerns.    DIET:  We do recommend a small meal  at first, but then you may proceed to your regular diet.  Drink plenty of fluids but you should avoid alcoholic beverages for 24 hours.  ACTIVITY:  You should plan to take it easy for the rest of today and you should NOT DRIVE or use heavy machinery until tomorrow (because of the sedation medicines used during the test).    FOLLOW UP: Our staff will call the number listed on your records 48-72 hours following your procedure to check on you and address any questions or concerns that you may have regarding the information given to you following your procedure. If we do not reach you, we will leave a message.  We will attempt to reach you two times.  During this call, we will ask if you have developed any symptoms of COVID 19. If you develop any symptoms (ie: fever, flu-like symptoms, shortness of breath, cough etc.) before then, please call [phone].  If you test positive for Covid 19 in the 2 weeks post procedure, please call and report this information to [place].    If any biopsies were taken you will be contacted by phone or by letter within the next 1-3 weeks.  Please call us at [phone] if you have not heard about the biopsies in 3 weeks.    SIGNATURES/CONFIDENTIALITY: You and/or your care partner have signed paperwork which will be entered into your electronic medical record.  These signatures attest to the fact that that the  information above on your After Visit Summary has been reviewed and is understood.  Full responsibility of the confidentiality of this discharge information lies with you and/or your care-partner.

## 2021-07-16 NOTE — Progress Notes (Signed)
Sedate, gd SR, tolerated procedure well, VSS, report to RN 

## 2021-07-16 NOTE — Progress Notes (Signed)
GASTROENTEROLOGY PROCEDURE H&P NOTE   Primary Care Physician: Dettinger, Fransisca Kaufmann, MD    Reason for Procedure:  History of multiple adenomatous colon polyps  Plan:    Surveillance colonoscopy  Patient is appropriate for endoscopic procedure(s) in the ambulatory ([facility]) setting.  The nature of the procedure, as well as the risks, benefits, and alternatives were carefully and thoroughly reviewed with the patient. Ample time for discussion and questions allowed. The patient understood, was satisfied, and agreed to proceed.     HPI: Frederick Barr is a 56 y.o. male who presents for surveillance colonoscopy.  Tolerated the prep.  No recent chest pain or shortness of breath.  Medical history as below.  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      POLYPECTOMY      Prior to Admission medications   Medication Sig Start Date End Date Taking? Authorizing Provider  lansoprazole (PREVACID) 30 MG capsule Take 1 capsule (30 mg total) by mouth daily at 12 noon. 08/22/20  Yes Dettinger, Fransisca Kaufmann, MD  propranolol (INDERAL) 20 MG tablet Take 1 tablet (20 mg total) by mouth 2 (two) times daily. 08/22/20  Yes Dettinger, Fransisca Kaufmann, MD  rosuvastatin (CRESTOR) 10 MG tablet Take 1 tablet (10 mg total) by mouth daily. 08/22/20  Yes Dettinger, Fransisca Kaufmann, MD  Vitamin D, Ergocalciferol, (DRISDOL) 1.25 MG (50000 UNIT) CAPS capsule TAKE 1 CAPSULE (50,000 UNITS TOTAL) BY MOUTH EVERY 7 (SEVEN) DAYS. 02/22/20  Yes Dettinger, Fransisca Kaufmann, MD  amLODipine-benazepril (LOTREL) 5-40 MG capsule Take 1 capsule by mouth daily. 08/22/20   Dettinger, Fransisca Kaufmann, MD  icosapent Ethyl (VASCEPA) 1  g capsule Take 2 capsules (2 g total) by mouth 2 (two) times daily. 08/22/20   Dettinger, Fransisca Kaufmann, MD    Current Outpatient Medications  Medication Sig Dispense Refill   lansoprazole (PREVACID) 30 MG capsule Take 1 capsule (30 mg total) by mouth daily at 12 noon. 90 capsule 3   propranolol (INDERAL) 20 MG tablet Take 1 tablet (20 mg total) by mouth 2 (two) times daily. 180 tablet 3   rosuvastatin (CRESTOR) 10 MG tablet Take 1 tablet (10 mg total) by mouth daily. 90 tablet 3   Vitamin D, Ergocalciferol, (DRISDOL) 1.25 MG (50000 UNIT) CAPS capsule TAKE 1 CAPSULE (50,000 UNITS TOTAL) BY MOUTH EVERY 7 (SEVEN) DAYS. 12 capsule 1   amLODipine-benazepril (LOTREL) 5-40 MG capsule Take 1 capsule by mouth daily. 90 capsule 3   icosapent Ethyl (VASCEPA) 1 g capsule Take 2 capsules (2 g total) by mouth 2 (two) times daily. 360 capsule 3   Current Facility-Administered Medications  Medication Dose Route Frequency Provider Last Rate Last Admin   0.9 %  sodium chloride infusion  500 mL Intravenous Once Abdulhamid Olgin, Lajuan Lines, MD        Allergies as of 07/16/2021   (No Known Allergies)    Family History  Problem Relation Age of Onset   Heart disease Mother 75   Stroke Mother    Hypertension Mother    Stroke Father    Alcoholism Father    Stroke  Brother 80   Cancer - Lung Maternal Grandmother    Colon cancer Neg Hx    Esophageal cancer Neg Hx    Rectal cancer Neg Hx    Stomach cancer Neg Hx    Colon polyps Neg Hx     Social History   Socioeconomic History   Marital status: Married    Spouse name: Frederick Barr   Number of children: 4   Years of education: masters   Highest education level: Not on file  Occupational History    Comment: Manufacturing systems engineer  Tobacco Use   Smoking status: Every Day    Packs/day: 0.50    Types: Cigarettes    Start date: 07/12/2005   Smokeless tobacco: Never   Tobacco comments:    smokes 2-3 cigarettes a day  Vaping Use   Vaping Use: Former  Substance and Sexual Activity    Alcohol use: Not Currently    Comment: quit 2004   Drug use: No   Sexual activity: Not on file  Other Topics Concern   Not on file  Social History Narrative   Lives with wife, son'   Caffeine=sodas 3-4 daily   Social Determinants of Health   Financial Resource Strain: Not on file  Food Insecurity: Not on file  Transportation Needs: Not on file  Physical Activity: Not on file  Stress: Not on file  Social Connections: Not on file  Intimate Partner Violence: Not on file    Physical Exam: Vital signs in last 24 hours: @BP  (!) 149/98 (BP Location: Right Arm, Patient Position: Sitting, Cuff Size: Normal)    Pulse (!) 58    Temp (!) 97.4 F (36.3 C) (Temporal)    Ht 5\' 7"  (1.702 m)    Wt 215 lb (97.5 kg)    SpO2 99%    BMI 33.67 kg/m  GEN: NAD EYE: Sclerae anicteric ENT: MMM CV: Non-tachycardic Pulm: CTA b/l GI: Soft, NT/ND NEURO:  Alert & Oriented x 3   Frederick Jarred, MD [facility]  07/16/2021 9:08 AM

## 2021-07-16 NOTE — Progress Notes (Signed)
No problems noted in the recovery room. maw 

## 2021-07-20 ENCOUNTER — Telehealth: Payer: Self-pay

## 2021-07-20 NOTE — Telephone Encounter (Signed)
°  Follow up Call-  Call back number 07/16/2021  Post procedure Call Back phone  # [phone]  Permission to leave phone message Yes  Some recent data might be hidden     Patient questions:  Do you have a fever, pain , or abdominal swelling? No. Pain Score  0 *  Have you tolerated food without any problems? Yes.    Have you been able to return to your normal activities? Yes.    Do you have any questions about your discharge instructions: Diet   No. Medications  No. Follow up visit  No.  Do you have questions or concerns about your Care? No.  Actions: * If pain score is 4 or above: No action needed, pain <4.

## 2021-07-24 ENCOUNTER — Encounter: Payer: Self-pay | Admitting: Internal Medicine

## 2021-08-21 ENCOUNTER — Other Ambulatory Visit: Payer: Self-pay | Admitting: Family Medicine

## 2021-08-21 ENCOUNTER — Encounter: Payer: Self-pay | Admitting: Family Medicine

## 2021-08-21 DIAGNOSIS — K2900 Acute gastritis without bleeding: Secondary | ICD-10-CM

## 2021-08-21 DIAGNOSIS — E782 Mixed hyperlipidemia: Secondary | ICD-10-CM

## 2021-08-21 NOTE — Telephone Encounter (Signed)
Letter Sent.

## 2021-08-21 NOTE — Telephone Encounter (Signed)
Dettinger NTBS LOV 08/22/20

## 2021-10-02 ENCOUNTER — Other Ambulatory Visit: Payer: Self-pay | Admitting: Family Medicine

## 2021-10-02 DIAGNOSIS — G4489 Other headache syndrome: Secondary | ICD-10-CM

## 2021-10-03 ENCOUNTER — Other Ambulatory Visit: Payer: Self-pay | Admitting: Family Medicine

## 2021-10-03 DIAGNOSIS — I10 Essential (primary) hypertension: Secondary | ICD-10-CM

## 2021-10-19 ENCOUNTER — Other Ambulatory Visit: Payer: Self-pay | Admitting: Family Medicine

## 2021-10-19 DIAGNOSIS — K2900 Acute gastritis without bleeding: Secondary | ICD-10-CM

## 2021-10-19 NOTE — Telephone Encounter (Signed)
?  Prescription Request ? ?10/19/2021 ? ?Is this a "Controlled Substance" medicine? NO ? ?Have you seen your PCP in the last 2 weeks? NO ? ?If YES, route message to pool  -  If NO, patient needs to be scheduled for appointment. ? ?What is the name of the medication or equipment? Lansoprazole 30 mg ? ?Have you contacted your pharmacy to request a refill? NO  ? ?Which pharmacy would you like this sent to? [facility] in [place] ? ? ?Patient notified that their request is being sent to the clinical staff for review and that they should receive a response within 2 business days.  ?  ?

## 2021-10-20 MED ORDER — LANSOPRAZOLE 30 MG PO CPDR: 30.0000 mg | DELAYED_RELEASE_CAPSULE | Freq: Every day | ORAL | 0 refills | Status: DC

## 2021-10-21 NOTE — Telephone Encounter (Signed)
Pt aware refill sent to pharmacy 

## 2021-11-09 ENCOUNTER — Other Ambulatory Visit: Payer: Self-pay | Admitting: Family Medicine

## 2021-11-09 DIAGNOSIS — I10 Essential (primary) hypertension: Secondary | ICD-10-CM

## 2021-11-09 NOTE — Telephone Encounter (Signed)
?  Prescription Request ? ?11/09/2021 ? ?What is the name of the medication or equipment? AMLODIPINE ? ?Have you contacted your pharmacy to request a refill? YES ? ?Which pharmacy would you like this sent to? [facility] [place] ? ?Pt scheduled to see PCP in a few weeks but is out of medicine and needs refill called in.  ? ?  ?  ?

## 2021-11-09 NOTE — Telephone Encounter (Signed)
Patient has not been seen in over a year. He has appointment on 05/26.  ?

## 2021-11-11 MED ORDER — AMLODIPINE BESY-BENAZEPRIL HCL 5-40 MG PO CAPS: 1.0000 | ORAL_CAPSULE | Freq: Every day | ORAL | 0 refills | Status: DC

## 2021-11-11 NOTE — Telephone Encounter (Signed)
Patient calling to check on status of medication being sent to pharmacy because he will be out after today. Please call to let him know.  ?

## 2021-11-12 NOTE — Telephone Encounter (Signed)
Please address. Patient has appt 5/26  ?

## 2021-12-03 ENCOUNTER — Other Ambulatory Visit: Payer: Self-pay | Admitting: Family Medicine

## 2021-12-03 DIAGNOSIS — I10 Essential (primary) hypertension: Secondary | ICD-10-CM

## 2021-12-04 ENCOUNTER — Encounter: Payer: Self-pay | Admitting: Family Medicine

## 2021-12-04 ENCOUNTER — Ambulatory Visit (INDEPENDENT_AMBULATORY_CARE_PROVIDER_SITE_OTHER): Payer: Managed Care, Other (non HMO) | Admitting: Family Medicine

## 2021-12-04 VITALS — BP 150/92 | HR 71 | Temp 97.2°F | Ht 67.0 in | Wt 217.0 lb

## 2021-12-04 DIAGNOSIS — Z0000 Encounter for general adult medical examination without abnormal findings: Secondary | ICD-10-CM

## 2021-12-04 DIAGNOSIS — G4489 Other headache syndrome: Secondary | ICD-10-CM | POA: Diagnosis not present

## 2021-12-04 DIAGNOSIS — K219 Gastro-esophageal reflux disease without esophagitis: Secondary | ICD-10-CM

## 2021-12-04 DIAGNOSIS — I10 Essential (primary) hypertension: Secondary | ICD-10-CM | POA: Diagnosis not present

## 2021-12-04 DIAGNOSIS — E782 Mixed hyperlipidemia: Secondary | ICD-10-CM | POA: Diagnosis not present

## 2021-12-04 DIAGNOSIS — Z0001 Encounter for general adult medical examination with abnormal findings: Secondary | ICD-10-CM | POA: Diagnosis not present

## 2021-12-04 DIAGNOSIS — N400 Enlarged prostate without lower urinary tract symptoms: Secondary | ICD-10-CM

## 2021-12-04 MED ORDER — PROPRANOLOL HCL 20 MG PO TABS: 20.0000 mg | ORAL_TABLET | Freq: Two times a day (BID) | ORAL | 3 refills | Status: DC

## 2021-12-04 MED ORDER — LANSOPRAZOLE 30 MG PO CPDR: 30.0000 mg | DELAYED_RELEASE_CAPSULE | Freq: Every day | ORAL | 0 refills | Status: DC

## 2021-12-04 MED ORDER — ROSUVASTATIN CALCIUM 10 MG PO TABS: 10.0000 mg | ORAL_TABLET | Freq: Every day | ORAL | 3 refills | Status: DC

## 2021-12-04 MED ORDER — AMLODIPINE BESY-BENAZEPRIL HCL 5-40 MG PO CAPS: ORAL_CAPSULE | ORAL | 3 refills | Status: DC

## 2021-12-04 MED ORDER — ICOSAPENT ETHYL 1 G PO CAPS: 2.0000 g | ORAL_CAPSULE | Freq: Two times a day (BID) | ORAL | 3 refills | Status: DC

## 2021-12-04 NOTE — Progress Notes (Signed)
BP (!) 150/92   Pulse 71   Temp (!) 97.2 F (36.2 C)   Ht '5\' 7"'  (1.702 m)   Wt 217 lb (98.4 kg)   SpO2 96%   BMI 33.99 kg/m    Subjective:   Patient ID: Frederick Barr, male    DOB: 01-04-1966, 56 y.o.   MRN: [id number]  HPI: Frederick Barr is a 56 y.o. male presenting on 12/04/2021 for Annual Exam   HPI Physical exam and recheck of chronic medical issues Patient denies any chest pain, shortness of breath, headaches or vision issues, abdominal complaints, diarrhea, nausea, vomiting, or joint issues.   Hypertension Patient is currently on amlodipine-benazepril, and their blood pressure today is 172/109. Patient denies any lightheadedness or dizziness. Patient denies headaches, blurred vision, chest pains, shortness of breath, or weakness. Denies any side effects from medication and is content with current medication.   Hyperlipidemia Patient is coming in for recheck of his hyperlipidemia. The patient is currently taking crestor. They deny any issues with myalgias or history of liver damage from it. They deny any focal numbness or weakness or chest pain.   BPH Patient is coming in for recheck on BPH Symptoms: No symptoms currently Medication: No medication currently Last PSA: Over a year ago, will check today  GERD Patient is currently on lansoprazole.  She denies any major symptoms or abdominal pain or belching or burping. She denies any blood in her stool or lightheadedness or dizziness.   Relevant past medical, surgical, family and social history reviewed and updated as indicated. Interim medical history since our last visit reviewed. Allergies and medications reviewed and updated.  Review of Systems  Constitutional:  Negative for chills and fever.  HENT:  Negative for ear pain and tinnitus.   Eyes:  Negative for pain and visual disturbance.  Respiratory:  Negative for cough, shortness of breath and wheezing.   Cardiovascular:  Negative for chest pain, palpitations and leg  swelling.  Gastrointestinal:  Negative for abdominal pain, blood in stool, constipation and diarrhea.  Genitourinary:  Negative for dysuria and hematuria.  Musculoskeletal:  Negative for back pain, gait problem and myalgias.  Skin:  Negative for rash.  Neurological:  Negative for dizziness, weakness and headaches.  Psychiatric/Behavioral:  Negative for suicidal ideas.   All other systems reviewed and are negative.  Per HPI unless specifically indicated above   Allergies as of 12/04/2021   No Known Allergies      Medication List        Accurate as of Dec 04, 2021 11:27 AM. If you have any questions, ask your nurse or doctor.          amLODipine-benazepril 5-40 MG capsule Commonly known as: LOTREL TAKE 1 CAPSULE BY MOUTH EVERY DAY What changed:  how much to take how to take this when to take this additional instructions Changed by: Fransisca Kaufmann Bridgit Eynon, MD   icosapent Ethyl 1 g capsule Commonly known as: Vascepa Take 2 capsules (2 g total) by mouth 2 (two) times daily.   lansoprazole 30 MG capsule Commonly known as: PREVACID Take 1 capsule (30 mg total) by mouth daily at 12 noon.   propranolol 20 MG tablet Commonly known as: INDERAL Take 1 tablet (20 mg total) by mouth 2 (two) times daily.   rosuvastatin 10 MG tablet Commonly known as: CRESTOR Take 1 tablet (10 mg total) by mouth daily.   Vitamin D (Ergocalciferol) 1.25 MG (50000 UNIT) Caps capsule Commonly known as: DRISDOL TAKE 1 CAPSULE (  50,000 UNITS TOTAL) BY MOUTH EVERY 7 (SEVEN) DAYS.         Objective:   BP (!) 150/92   Pulse 71   Temp (!) 97.2 F (36.2 C)   Ht '5\' 7"'  (1.702 m)   Wt 217 lb (98.4 kg)   SpO2 96%   BMI 33.99 kg/m   Wt Readings from Last 3 Encounters:  12/04/21 217 lb (98.4 kg)  07/16/21 215 lb (97.5 kg)  07/02/21 215 lb (97.5 kg)    Physical Exam Vitals reviewed.  Constitutional:      General: He is not in acute distress.    Appearance: He is well-developed. He is not  diaphoretic.  HENT:     Right Ear: External ear normal.     Left Ear: External ear normal.     Nose: Nose normal.     Mouth/Throat:     Pharynx: No oropharyngeal exudate.  Eyes:     General: No scleral icterus.       Right eye: No discharge.     Conjunctiva/sclera: Conjunctivae normal.     Pupils: Pupils are equal, round, and reactive to light.  Neck:     Thyroid: No thyromegaly.  Cardiovascular:     Rate and Rhythm: Normal rate and regular rhythm.     Heart sounds: Normal heart sounds. No murmur heard. Pulmonary:     Effort: Pulmonary effort is normal. No respiratory distress.     Breath sounds: Normal breath sounds. No wheezing.  Abdominal:     General: Bowel sounds are normal. There is no distension.     Palpations: Abdomen is soft.     Tenderness: There is no abdominal tenderness. There is no guarding or rebound.  Genitourinary:    Prostate: Enlarged. Not tender and no nodules present.     Rectum: External hemorrhoid present.    Musculoskeletal:        General: Normal range of motion.     Cervical back: Neck supple.  Lymphadenopathy:     Cervical: No cervical adenopathy.  Skin:    General: Skin is warm and dry.     Findings: No rash.  Neurological:     Mental Status: He is alert and oriented to person, place, and time.     Coordination: Coordination normal.  Psychiatric:        Behavior: Behavior normal.      Assessment & Plan:   Problem List Items Addressed This Visit       Cardiovascular and Mediastinum   Hypertension   Relevant Medications   amLODipine-benazepril (LOTREL) 5-40 MG capsule   icosapent Ethyl (VASCEPA) 1 g capsule   propranolol (INDERAL) 20 MG tablet   rosuvastatin (CRESTOR) 10 MG tablet   Other Relevant Orders   CMP14+EGFR     Digestive   GERD (gastroesophageal reflux disease)   Relevant Medications   lansoprazole (PREVACID) 30 MG capsule     Genitourinary   Benign prostatic hyperplasia without lower urinary tract symptoms    Relevant Orders   PSA, total and free     Other   Hyperlipidemia   Relevant Medications   amLODipine-benazepril (LOTREL) 5-40 MG capsule   icosapent Ethyl (VASCEPA) 1 g capsule   propranolol (INDERAL) 20 MG tablet   rosuvastatin (CRESTOR) 10 MG tablet   Other Relevant Orders   Lipid panel   Other Visit Diagnoses     Physical exam    -  Primary   Relevant Orders   CBC with Differential/Platelet  CMP14+EGFR   Lipid panel   PSA, total and free   Headache syndrome       Relevant Medications   propranolol (INDERAL) 20 MG tablet     Patient seems to be doing well except for blood pressure but he says it runs good at home.  He says he just gets very nervous at the doctor's office.  Blood pressure still slightly elevated on recheck, he will keep an eye on it over the next 2 weeks and call me in some numbers and then we will decide from there if we have to increase the medicine.  Follow up plan: Return in about 1 year (around 12/05/2022), or if symptoms worsen or fail to improve, for Physical exam.  Counseling provided for all of the vaccine components Orders Placed This Encounter  Procedures   CBC with Differential/Platelet   CMP14+EGFR   Lipid panel   PSA, total and free    Caryl Pina, MD [facility] 12/04/2021, 11:27 AM

## 2021-12-05 LAB — LIPID PANEL
Chol/HDL Ratio: 5.9 ratio — ABNORMAL HIGH (ref 0.0–5.0)
Cholesterol, Total: 183 mg/dL (ref 100–199)
HDL: 31 mg/dL — ABNORMAL LOW (ref >39)
LDL Chol Calc (NIH): 100 mg/dL — ABNORMAL HIGH (ref 0–99)
Triglycerides: 309 mg/dL — ABNORMAL HIGH (ref 0–149)
VLDL Cholesterol Cal: 52 mg/dL — ABNORMAL HIGH (ref 5–40)

## 2021-12-05 LAB — CBC WITH DIFFERENTIAL/PLATELET
Basophils Absolute: 0.1 10*3/uL (ref 0.0–0.2)
Basos: 1 %
EOS (ABSOLUTE): 0.2 10*3/uL (ref 0.0–0.4)
Eos: 2 %
Hematocrit: 47.8 % (ref 37.5–51.0)
Hemoglobin: 15.7 g/dL (ref 13.0–17.7)
Immature Grans (Abs): 0 10*3/uL (ref 0.0–0.1)
Immature Granulocytes: 0 %
Lymphocytes Absolute: 3 10*3/uL (ref 0.7–3.1)
Lymphs: 41 %
MCH: 27.5 pg (ref 26.6–33.0)
MCHC: 32.8 g/dL (ref 31.5–35.7)
MCV: 84 fL (ref 79–97)
Monocytes Absolute: 0.5 10*3/uL (ref 0.1–0.9)
Monocytes: 7 %
Neutrophils Absolute: 3.7 10*3/uL (ref 1.4–7.0)
Neutrophils: 49 %
Platelets: 280 10*3/uL (ref 150–450)
RBC: 5.7 x10E6/uL (ref 4.14–5.80)
RDW: 13.7 % (ref 11.6–15.4)
WBC: 7.5 10*3/uL (ref 3.4–10.8)

## 2021-12-05 LAB — CMP14+EGFR
ALT: 23 IU/L (ref 0–44)
AST: 16 IU/L (ref 0–40)
Albumin/Globulin Ratio: 2 (ref 1.2–2.2)
Albumin: 4.5 g/dL (ref 3.8–4.9)
Alkaline Phosphatase: 83 IU/L (ref 44–121)
BUN/Creatinine Ratio: 8 — ABNORMAL LOW (ref 9–20)
BUN: 8 mg/dL (ref 6–24)
Bilirubin Total: 0.4 mg/dL (ref 0.0–1.2)
CO2: 25 mmol/L (ref 20–29)
Calcium: 9.7 mg/dL (ref 8.7–10.2)
Chloride: 101 mmol/L (ref 96–106)
Creatinine, Ser: 1.01 mg/dL (ref 0.76–1.27)
Globulin, Total: 2.2 g/dL (ref 1.5–4.5)
Glucose: 110 mg/dL — ABNORMAL HIGH (ref 70–99)
Potassium: 4.6 mmol/L (ref 3.5–5.2)
Sodium: 140 mmol/L (ref 134–144)
Total Protein: 6.7 g/dL (ref 6.0–8.5)
eGFR: 87 mL/min/{1.73_m2} (ref >59)

## 2021-12-05 LAB — PSA, TOTAL AND FREE
PSA, Free Pct: 40 %
PSA, Free: 0.36 ng/mL
Prostate Specific Ag, Serum: 0.9 ng/mL (ref 0.0–4.0)

## 2021-12-18 NOTE — Progress Notes (Signed)
Patient returning call. Please call back

## 2022-01-14 ENCOUNTER — Ambulatory Visit (INDEPENDENT_AMBULATORY_CARE_PROVIDER_SITE_OTHER): Payer: Managed Care, Other (non HMO) | Admitting: Family Medicine

## 2022-01-14 ENCOUNTER — Encounter: Payer: Self-pay | Admitting: Family Medicine

## 2022-01-14 VITALS — BP 163/90 | HR 69 | Temp 97.9°F | Ht 67.0 in | Wt 213.2 lb

## 2022-01-14 DIAGNOSIS — R030 Elevated blood-pressure reading, without diagnosis of hypertension: Secondary | ICD-10-CM | POA: Diagnosis not present

## 2022-01-14 DIAGNOSIS — K219 Gastro-esophageal reflux disease without esophagitis: Secondary | ICD-10-CM

## 2022-01-14 DIAGNOSIS — M546 Pain in thoracic spine: Secondary | ICD-10-CM

## 2022-01-14 MED ORDER — FAMOTIDINE 40 MG PO TABS: 40.0000 mg | ORAL_TABLET | Freq: Two times a day (BID) | ORAL | 0 refills | Status: DC

## 2022-01-14 MED ORDER — CYCLOBENZAPRINE HCL 10 MG PO TABS: 10.0000 mg | ORAL_TABLET | Freq: Three times a day (TID) | ORAL | 0 refills | Status: DC | PRN

## 2022-01-14 MED ORDER — MELOXICAM 15 MG PO TABS: 15.0000 mg | ORAL_TABLET | Freq: Every day | ORAL | 0 refills | Status: DC

## 2022-01-14 MED ORDER — LANSOPRAZOLE 30 MG PO CPDR: 30.0000 mg | DELAYED_RELEASE_CAPSULE | Freq: Every day | ORAL | 0 refills | Status: DC

## 2022-01-14 NOTE — Progress Notes (Signed)
Acute Office Visit  Subjective:     Patient ID: Frederick Barr, male    DOB: Jun 08, 1966, 56 y.o.   MRN: [id number]  Chief Complaint  Patient presents with   Gastroesophageal Reflux   Back Pain    Back Pain This is a new problem. The current episode started 1 to 4 weeks ago. The problem occurs constantly. The problem is unchanged. The pain is present in the thoracic spine (right mid thoracic). The quality of the pain is described as aching, shooting and stabbing. Radiates to: right chest. The pain is moderate. Exacerbated by: lifting or moving arms. Associated symptoms include chest pain. Pertinent negatives include no abdominal pain, bladder incontinence, bowel incontinence, dysuria, fever, headaches, leg pain, numbness, paresis, paresthesias, pelvic pain, perianal numbness, tingling, weakness or weight loss. He has tried home exercises (lying down) for the symptoms. The treatment provided mild relief.   He reports that this has happened in the past with these exact symptoms with a flare of his GERD. He does reports increased belching. Denies heartburn, regurgitation, vomiting, nausea, abdominal pain. He has been drinking more carbonated drinks recently. He has since cut this back without improvement. He takes prevacid daily on and empty stomach.    Review of Systems  Constitutional:  Negative for fever and weight loss.  Respiratory:  Negative for shortness of breath and wheezing.   Cardiovascular:  Positive for chest pain. Negative for palpitations and leg swelling.  Gastrointestinal:  Negative for abdominal pain, blood in stool, bowel incontinence, constipation, diarrhea, heartburn, melena, nausea and vomiting.  Genitourinary:  Negative for bladder incontinence, dysuria and pelvic pain.  Musculoskeletal:  Positive for back pain.  Neurological:  Negative for dizziness, tingling, weakness, numbness, headaches and paresthesias.        Objective:    BP (!) 163/90   Pulse 69   Temp  97.9 F (36.6 C) (Temporal)   Ht '5\' 7"'$  (1.702 m)   Wt 213 lb 4 oz (96.7 kg)   SpO2 97%   BMI 33.40 kg/m  BP Readings from Last 3 Encounters:  01/14/22 (!) 163/90  12/04/21 (!) 150/92  07/16/21 117/83      Physical Exam Vitals and nursing note reviewed.  Constitutional:      General: He is not in acute distress.    Appearance: He is not ill-appearing, toxic-appearing or diaphoretic.  HENT:     Head: Normocephalic and atraumatic.  Cardiovascular:     Rate and Rhythm: Normal rate and regular rhythm.     Heart sounds: Normal heart sounds. No murmur heard. Pulmonary:     Effort: Pulmonary effort is normal. No respiratory distress.     Breath sounds: Normal breath sounds.  Chest:     Chest wall: No mass, swelling or tenderness.  Abdominal:     General: Bowel sounds are normal. There is no distension.     Palpations: Abdomen is soft.     Tenderness: There is no abdominal tenderness. There is no guarding or rebound.  Musculoskeletal:     Cervical back: Normal range of motion. No rigidity.     Thoracic back: Tenderness (right mid thoracic) present. No swelling, edema, deformity, signs of trauma, lacerations or bony tenderness.     Right lower leg: No edema.     Left lower leg: No edema.  Skin:    General: Skin is warm and dry.  Neurological:     General: No focal deficit present.     Mental Status: He is alert.  Motor: No weakness.     Gait: Gait normal.  Psychiatric:        Mood and Affect: Mood normal.        Behavior: Behavior normal.     No results found for any visits on 01/14/22.      Assessment & Plan:   Ildefonso was seen today for gastroesophageal reflux and back pain.  Diagnoses and all orders for this visit:  Acute right-sided thoracic back pain Discussed MSK etiology. Flexeril prn. Mobic daily as needed, do not take other NAIDs with mobic. Discussed heat, massage, stretching.  -     cyclobenzaprine (FLEXERIL) 10 MG tablet; Take 1 tablet (10 mg total) by  mouth 3 (three) times daily as needed for muscle spasms. -     meloxicam (MOBIC) 15 MG tablet; Take 1 tablet (15 mg total) by mouth daily.  Gastroesophageal reflux disease, unspecified whether esophagitis present ? Flare. Discussed symptoms and exam are consistent with MSK pain. He is reports increased belching however and reports exam same symptoms with last GERD flare. Continue prevacid. Add pepcid BID for the next week then prn after.  -     lansoprazole (PREVACID) 30 MG capsule; Take 1 capsule (30 mg total) by mouth daily at 12 noon. -     famotidine (PEPCID) 40 MG tablet; Take 1 tablet (40 mg total) by mouth 2 (two) times daily.  Elevated BP without diagnosis of hypertension Asymptomatic. BP log given. Follow up with PCP for persistently elevated home readings.   Return if symptoms worsen or fail to improve.  The patient indicates understanding of these issues and agrees with the plan.  Gwenlyn Perking, FNP

## 2022-01-14 NOTE — Patient Instructions (Signed)

## 2022-01-22 ENCOUNTER — Ambulatory Visit (INDEPENDENT_AMBULATORY_CARE_PROVIDER_SITE_OTHER): Payer: Managed Care, Other (non HMO)

## 2022-01-22 ENCOUNTER — Ambulatory Visit (INDEPENDENT_AMBULATORY_CARE_PROVIDER_SITE_OTHER): Payer: Managed Care, Other (non HMO) | Admitting: Family Medicine

## 2022-01-22 ENCOUNTER — Encounter: Payer: Self-pay | Admitting: Family Medicine

## 2022-01-22 VITALS — BP 147/93 | HR 62 | Temp 97.4°F | Resp 20 | Ht 67.0 in | Wt 215.0 lb

## 2022-01-22 DIAGNOSIS — I10 Essential (primary) hypertension: Secondary | ICD-10-CM | POA: Diagnosis not present

## 2022-01-22 DIAGNOSIS — M546 Pain in thoracic spine: Secondary | ICD-10-CM

## 2022-01-22 DIAGNOSIS — M5412 Radiculopathy, cervical region: Secondary | ICD-10-CM

## 2022-01-22 DIAGNOSIS — M5414 Radiculopathy, thoracic region: Secondary | ICD-10-CM

## 2022-01-22 MED ORDER — PREDNISONE 20 MG PO TABS: 40.0000 mg | ORAL_TABLET | Freq: Every day | ORAL | 0 refills | Status: AC

## 2022-01-22 MED ORDER — AMLODIPINE BESY-BENAZEPRIL HCL 10-40 MG PO CAPS: 1.0000 | ORAL_CAPSULE | Freq: Every day | ORAL | 1 refills | Status: DC

## 2022-01-22 NOTE — Progress Notes (Signed)
Established Patient Office Visit  Subjective   Patient ID: Frederick Barr, male    DOB: [birth year]  Age: 56 y.o. MRN: [id number]  Chief Complaint  Patient presents with   Numbness in fingers on right hand    Concerned he has a pinched nerve   BP high at home    HPI Frederick Barr reports some intermittent numbness and tingling in his right 4th and 5th finger for 1 week. He also has been having some pain that radiates down the top of his right shoulder and down his right arm. He denies neck pain, decreased ROM, or injury. He has been taking meloxicam without improvement. He does repetitive movements often at work.   He continues to have right sided throcic back pain. This has been improving. The pain sometimes radiates to the front of his chest. Denies injury. He has treid flexeril and meloxicam with some improvement. He has also been doing exercises and stretching at home.   Reports that his BP is consistently elevated 140-150s/90s at home. He is taking his medication as prescribed.   Past Medical History:  Diagnosis Date   Headache    Hyperlipidemia    Hypertension    Seizures ([place])    as a teenager/ last seizure at age 55    ROS Negative unless specially indicated above in HPI.   Objective:     BP (!) 147/93   Pulse 62   Temp (!) 97.4 F (36.3 C) (Temporal)   Resp 20   Ht '5\' 7"'$  (1.702 m)   Wt 215 lb (97.5 kg)   SpO2 97%   BMI 33.67 kg/m  BP Readings from Last 3 Encounters:  01/22/22 (!) 147/93  01/14/22 (!) 163/90  12/04/21 (!) 150/92      Physical Exam Vitals and nursing note reviewed.  Constitutional:      General: He is not in acute distress.    Appearance: He is not ill-appearing, toxic-appearing or diaphoretic.  Cardiovascular:     Rate and Rhythm: Normal rate and regular rhythm.     Heart sounds: Normal heart sounds. No murmur heard. Pulmonary:     Effort: Pulmonary effort is normal. No respiratory distress.  Musculoskeletal:     Right shoulder: No  swelling, effusion, tenderness or bony tenderness. Normal range of motion. Normal strength.     Right upper arm: Normal.     Right wrist: Normal.     Right hand: Normal.     Cervical back: No edema, erythema, rigidity or crepitus. No pain with movement, spinous process tenderness or muscular tenderness. Normal range of motion.     Thoracic back: Tenderness (right paraspinal) present. No swelling, edema, deformity, signs of trauma or bony tenderness.     Right lower leg: No edema.     Left lower leg: No edema.     Comments: Negative empty can test of right shoulder. Negative painful arc.   Skin:    General: Skin is warm and dry.  Neurological:     General: No focal deficit present.     Mental Status: He is alert and oriented to person, place, and time.     Motor: No weakness.     Gait: Gait normal.  Psychiatric:        Mood and Affect: Mood normal.        Behavior: Behavior normal.        Thought Content: Thought content normal.        Judgment: Judgment normal.  No results found for any visits on 01/22/22.    The 10-year ASCVD risk score (Arnett DK, et al., 2019) is: 24.5%    Assessment & Plan:   Frederick Barr was seen today for numbness in fingers on right hand and bp high at home.  Diagnoses and all orders for this visit:  Cervical radiculopathy Prednisone burst as below. Continue meloxicam. Xray today in office, report pending. Will notify pt of results.  -     predniSONE (DELTASONE) 20 MG tablet; Take 2 tablets (40 mg total) by mouth daily with breakfast for 5 days. -     DG Cervical Spine 2 or 3 views; Future  Acute right-sided thoracic back pain Xray today in office. Will notify patient of findings when report is available. Continue meloxicam and stretching.  -     DG Thoracic Spine 2 View; Future  Primary hypertension Not at goal. Increase amlodipine to 10 mg in Lotrel. Monitor BP at home.  -     amLODipine-benazepril (LOTREL) 10-40 MG capsule; Take 1 capsule by  mouth daily.  Return in about 2 weeks (around 02/05/2022) for follow up with PCP.   The patient indicates understanding of these issues and agrees with the plan.  Gwenlyn Perking, FNP

## 2022-01-22 NOTE — Patient Instructions (Signed)
Cervical Radiculopathy  Cervical radiculopathy happens when a nerve in the neck (a cervical nerve) is pinched or bruised. This condition can happen because of an injury to the cervical spine (vertebrae) in the neck, or as part of the normal aging process. Pressure on the cervical nerves can cause pain or numbness that travels from the neck all the way down to the arm and fingers. This condition usually gets better with rest. Treatment may be needed if the condition does not improve. What are the causes? This condition may be caused by: A neck injury. A bulging (herniated) disk. Muscle spasms. Muscle tightness in the neck due to overuse. Arthritis. Breakdown or degeneration in the bones and joints of the spine (spondylosis) due to aging. Bone spurs that may develop near the cervical nerves. What are the signs or symptoms? Symptoms of this condition include: Pain. The pain may travel from the neck to the arm and hand. The pain can be severe or irritating. It may get worse when you move your neck. Numbness or tingling in your arm or hand. Weakness in the affected arm and hand, in severe cases. How is this diagnosed? This condition may be diagnosed based on your symptoms, your medical history, and a physical exam. You may also have tests, including: X-rays. CT scan. MRI. Electromyogram (EMG). Nerve conduction tests. How is this treated? In many cases, treatment is not needed for this condition. With rest, the condition usually gets better over time. If treatment is needed, options may include: Wearing a soft neck collar (cervical collar) for short periods of time. Doing physical therapy to strengthen your neck muscles. Taking medicines. These may include NSAIDs, such as ibuprofen, or oral corticosteroids. Having spinal injections, in severe cases. Having surgery. This may be needed if other treatments do not help. Different types of surgery may be done depending on the cause of this  condition. Follow these instructions at home: If you have a cervical collar: Wear it as told by your health care provider. Remove it only as told by your health care provider. Ask your health care provider if you can remove the cervical collar for cleaning and bathing. If you are allowed to remove the collar for cleaning or bathing: Follow instructions from your health care provider about how to remove the collar safely. Clean the collar by wiping it with mild soap and water and drying it completely. Take out any removable pads in the collar every 1-2 days, and wash them by hand with soap and water. Let them air-dry completely before you put them back in the collar. Check your skin under the collar for irritation or sores. If you see any, tell your health care provider. Managing pain     Take over-the-counter and prescription medicines only as told by your health care provider. If directed, put ice on the affected area. To do this: If you have a soft neck collar, remove it as told by your health care provider. Put ice in a plastic bag. Place a towel between your skin and the bag. Leave the ice on for 20 minutes, 2-3 times a day. Remove the ice if your skin turns bright red. This is very important. If you cannot feel pain, heat, or cold, you have a greater risk of damage to the area. If applying ice does not help, you can try using heat. Use the heat source that your health care provider recommends, such as a moist heat pack or a heating pad. Place a towel between   your skin and the heat source. Leave the heat on for 20-30 minutes. Remove the heat if your skin turns bright red. This is especially important if you are unable to feel pain, heat, or cold. You have a greater risk of getting burned. Try a gentle neck and shoulder massage to help relieve symptoms. Activity Rest as needed. Return to your normal activities as told by your health care provider. Ask your health care provider what  activities are safe for you. Do stretching and strengthening exercises as told by your health care provider or your physical therapist. You may have to avoid lifting. Ask your health care provider how much you can safely lift. General instructions Use a flat pillow when you sleep. Do not drive while wearing a cervical collar. If you do not have a cervical collar, ask your health care provider if it is safe to drive while your neck heals. Ask your health care provider if the medicine prescribed to you requires you to avoid driving or using machinery. Do not use any products that contain nicotine or tobacco. These products include cigarettes, chewing tobacco, and vaping devices, such as e-cigarettes. If you need help quitting, ask your health care provider. Keep all follow-up visits. This is important. Contact a health care provider if: Your condition does not improve with treatment. Get help right away if: Your pain gets much worse and is not controlled with medicines. You have weakness or numbness in your hand, arm, face, or leg. You have a high fever. You have a stiff, rigid neck. You lose control of your bowels or your bladder (have incontinence). You have trouble with walking, balance, or speaking. Summary Cervical radiculopathy happens when a nerve in the neck is pinched or bruised. A nerve can get pinched from a bulging disk, arthritis, muscle spasms, or an injury to the neck. Symptoms include pain, tingling, or numbness radiating from the neck to the arm or hand. Weakness can also occur in severe cases. Treatment may include rest, wearing a cervical collar, and physical therapy. Medicines may be prescribed to help with pain. In severe cases, injections or surgery may be needed. This information is not intended to replace advice given to you by your health care provider. Make sure you discuss any questions you have with your health care provider. Document Revised: 01/01/2021 Document  Reviewed: 01/01/2021 Elsevier Patient Education  2023 [employer]  

## 2022-02-02 ENCOUNTER — Other Ambulatory Visit: Payer: Self-pay

## 2022-02-02 ENCOUNTER — Ambulatory Visit: Payer: Managed Care, Other (non HMO) | Attending: Family Medicine

## 2022-02-02 DIAGNOSIS — R208 Other disturbances of skin sensation: Secondary | ICD-10-CM | POA: Diagnosis present

## 2022-02-02 DIAGNOSIS — M5412 Radiculopathy, cervical region: Secondary | ICD-10-CM | POA: Diagnosis not present

## 2022-02-02 DIAGNOSIS — M79601 Pain in right arm: Secondary | ICD-10-CM | POA: Insufficient documentation

## 2022-02-02 DIAGNOSIS — M5414 Radiculopathy, thoracic region: Secondary | ICD-10-CM | POA: Insufficient documentation

## 2022-02-02 NOTE — Therapy (Signed)
OUTPATIENT PHYSICAL THERAPY CERVICAL EVALUATION   Patient Name: Frederick Barr MRN: [id number] DOB:09/15/1965, 56 y.o., male Today's Date: 02/02/2022   PT End of Session - 02/02/22 0817     Visit Number 1    Number of Visits 8    Date for PT Re-Evaluation 04/09/22    PT Start Time 0818    PT Stop Time 0858    PT Time Calculation (min) 40 min    Activity Tolerance Patient tolerated treatment well    Behavior During Therapy [facility] for tasks assessed/performed             Past Medical History:  Diagnosis Date   Headache    Hyperlipidemia    Hypertension    Seizures ([place])    as a teenager/ last seizure at age 56   Past Surgical History:  Procedure Laterality Date   COLONOSCOPY  10/06/2017   Dr.Pyrtle   COLONOSCOPY  2015   HEMORRHOID SURGERY  2012   excised overlying skin on prolapsed internal hemorrhoids and evacuated a large number of clots.      POLYPECTOMY     Patient Active Problem List   Diagnosis Date Noted   GERD (gastroesophageal reflux disease) 10/13/2018   Tobacco use 06/27/2018   Vitamin D deficiency 09/06/2014   Hyperlipidemia 03/06/2014   Benign prostatic hyperplasia without lower urinary tract symptoms 03/06/2014   Family history of heart disease 03/06/2014   Hypertension 09/11/2013    PCP: Dettinger, Fransisca Kaufmann, MD   REFERRING PROVIDER: Gwenlyn Perking, FNP  REFERRING DIAG: Cervical radiculopathy; Thoracic radiculopathy  THERAPY DIAG:  Pain in right arm  Other disturbances of skin sensation  Rationale for Evaluation and Treatment Rehabilitation  ONSET DATE: about 3 weeks ago  SUBJECTIVE:                                                                                                                                                                                                         SUBJECTIVE STATEMENT: Patient reports he began to experience pain in his mid back and chest while having reflux issues. However, about a week later he began  noticing numbness in the 4th and 5th finger of his right hand. He has also experienced pain in his right shoulder and elbow, but the numbness is only located in his right hand. He has tried doing some exercises at home, but they only make the pain worse. He feels that his pain is fairly steady since it first began. He is right hand dominant. He has never had anything like this before.  PERTINENT HISTORY:  HTN  PAIN:  Are you having pain? Yes: NPRS scale: 4-5/10 Pain location: right shoulder and arm Pain description: ache Aggravating factors: none found Relieving factors: getting up and moving  PRECAUTIONS: None  WEIGHT BEARING RESTRICTIONS No  FALLS:  Has patient fallen in last 6 months? 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  NEXT MD FOLLOW UP: 02/10/22  PLOF: Independent  PATIENT GOALS reduced pain and numbness  OBJECTIVE:   PATIENT SURVEYS:  FOTO to be completed at follow up   COGNITION: Overall cognitive status: Within functional limits for tasks assessed   SENSATION: Light touch: Impaired  Diminished sensation along right C8 dermatome  POSTURE: rounded shoulders and forward head  PALPATION: TTP: right upper trapezius and right scapular retractors   CERVICAL ROM:   Active ROM A/PROM (deg) eval  Flexion 40  Extension 54  Right lateral flexion WFL  Left lateral flexion WFL  Right rotation 80  Left rotation 80   (Blank rows = not tested)  UPPER EXTREMITY ROM: WFL for activities assessed    UPPER EXTREMITY MMT:  MMT Right eval Left eval  Shoulder flexion 4+/5 4+/5  Shoulder extension    Shoulder abduction 5/5 5/5  Shoulder adduction    Shoulder extension    Shoulder internal rotation    Shoulder external rotation    Middle trapezius    Lower trapezius    Elbow flexion 4+/5 4+/5  Elbow extension 5/5 5/5  Wrist flexion    Wrist extension     Wrist ulnar deviation    Wrist radial deviation    Wrist pronation    Wrist supination    Grip strength 95 95   (Blank rows = not tested)  CERVICAL SPECIAL TESTS:  Upper limb tension test (ULTT): Positive, Spurling's test: Negative, Distraction test: Negative, and Sharp pursor's test: Negative  TODAY'S TREATMENT:                                    7/25 EXERCISE LOG  Exercise Repetitions and Resistance Comments  Ulnar nerve glides Attempted, but aggravated familiar numbness   Upper trapezius stretch 2 x 30 seconds    Scapular depression Attempted, but aggravated familiar symptoms   Scapular retraction Attempted, but aggravated familiar symptoms        Blank cell = exercise not performed today    PATIENT EDUCATION:  Education details: nerve mobility,  Person educated: Patient Education method: Explanation Education comprehension: verbalized understanding   HOME EXERCISE PROGRAM: [id number]  ASSESSMENT:  CLINICAL IMPRESSION: Patient is a 56 y.o. male who was seen today for physical therapy evaluation and treatment for right shoulder and elbow pain with numbness in the 4th and 5th fingers of his right hand. He presented with moderate pain severity and irritability with ulnar nerve testing aggravating his familiar symptoms. He exhibited no significant strength or mobility deficits compared to his left upper extremity. He was provided a HEP which he was able to properly demonstrate. Recommend that he continue with skilled physical therapy to address his remaining impairments to return to his prior level of function.    OBJECTIVE IMPAIRMENTS decreased activity tolerance, impaired sensation, impaired tone, impaired UE functional use, postural dysfunction, and pain.   ACTIVITY LIMITATIONS  none reported  PARTICIPATION LIMITATIONS: community activity  PERSONAL FACTORS 1 comorbidity: HTN  are also  affecting patient's functional outcome.   REHAB POTENTIAL: Good  CLINICAL DECISION  MAKING: Evolving/moderate complexity  EVALUATION COMPLEXITY: Moderate   GOALS: Goals reviewed with patient? Yes  LONG TERM GOALS: Target date: 03/02/2022  Patient will be independent with his HEP.  Baseline:  Goal status: INITIAL  2.  Patient will be able to complete his daily activities without his familiar pain exceeding 2/10. Baseline: 4-5/10 Goal status: INITIAL  3.  Patient will be able to complete his critical job demands without reproducing his familiar right hand numbness.  Baseline:  Goal status: INITIAL   PLAN: PT FREQUENCY: 2x/week  PT DURATION: 4 weeks  PLANNED INTERVENTIONS: Therapeutic exercises, Therapeutic activity, Neuromuscular re-education, Patient/Family education, Self Care, Joint mobilization, Joint manipulation, Dry Needling, Electrical stimulation, Spinal manipulation, Spinal mobilization, Cryotherapy, Moist heat, Taping, Traction, Ultrasound, Manual therapy, and Re-evaluation  PLAN FOR NEXT SESSION: UBE, STM to right upper trapezius, and modalities as needed    Darlin Coco, PT 02/02/2022, 6:09 PM

## 2022-02-05 ENCOUNTER — Other Ambulatory Visit: Payer: Self-pay | Admitting: Family Medicine

## 2022-02-05 DIAGNOSIS — K219 Gastro-esophageal reflux disease without esophagitis: Secondary | ICD-10-CM

## 2022-02-08 ENCOUNTER — Ambulatory Visit: Payer: Managed Care, Other (non HMO)

## 2022-02-08 DIAGNOSIS — R208 Other disturbances of skin sensation: Secondary | ICD-10-CM

## 2022-02-08 DIAGNOSIS — M79601 Pain in right arm: Secondary | ICD-10-CM

## 2022-02-08 NOTE — Therapy (Signed)
OUTPATIENT PHYSICAL THERAPY CERVICAL TREATMENT NOTE   Patient Name: Frederick Barr MRN: [id number] DOB:1966/04/08, 56 y.o., male Today's Date: 02/08/2022   PT End of Session - 02/08/22 0823     Visit Number 2    Number of Visits 8    Date for PT Re-Evaluation 04/09/22    PT Start Time 0820    PT Stop Time 0903    PT Time Calculation (min) 43 min    Activity Tolerance Patient tolerated treatment well    Behavior During Therapy [facility] for tasks assessed/performed             Past Medical History:  Diagnosis Date   Headache    Hyperlipidemia    Hypertension    Seizures ([place])    as a teenager/ last seizure at age 63   Past Surgical History:  Procedure Laterality Date   COLONOSCOPY  10/06/2017   Dr.Pyrtle   COLONOSCOPY  2015   HEMORRHOID SURGERY  2012   excised overlying skin on prolapsed internal hemorrhoids and evacuated a large number of clots.      POLYPECTOMY     Patient Active Problem List   Diagnosis Date Noted   GERD (gastroesophageal reflux disease) 10/13/2018   Tobacco use 06/27/2018   Vitamin D deficiency 09/06/2014   Hyperlipidemia 03/06/2014   Benign prostatic hyperplasia without lower urinary tract symptoms 03/06/2014   Family history of heart disease 03/06/2014   Hypertension 09/11/2013    PCP: Dettinger, Fransisca Kaufmann, MD   REFERRING PROVIDER: Gwenlyn Perking, FNP  REFERRING DIAG: Cervical radiculopathy; Thoracic radiculopathy  THERAPY DIAG:  Pain in right arm  Other disturbances of skin sensation  Rationale for Evaluation and Treatment Rehabilitation  ONSET DATE: about 3 weeks ago  SUBJECTIVE:                                                                                                                                                                                                         SUBJECTIVE STATEMENT: Pt arrives for today's treatment session reporting that he is feeling better.  PERTINENT HISTORY:  HTN  PAIN:  Are you having  pain? Yes: NPRS scale: 2-3/10 Pain location: right shoulder and arm Pain description: ache Aggravating factors: none found Relieving factors: getting up and moving  PRECAUTIONS: None  WEIGHT BEARING RESTRICTIONS No  FALLS:  Has patient fallen in last 6 months? 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  NEXT MD FOLLOW UP: 02/10/22  PLOF: Independent  PATIENT GOALS reduced pain and numbness  OBJECTIVE:   PATIENT SURVEYS:  FOTO to be completed at follow up   COGNITION: Overall cognitive status: Within functional limits for tasks assessed   SENSATION: Light touch: Impaired  Diminished sensation along right C8 dermatome  POSTURE: rounded shoulders and forward head  PALPATION: TTP: right upper trapezius and right scapular retractors   CERVICAL ROM:   Active ROM A/PROM (deg) eval  Flexion 40  Extension 54  Right lateral flexion WFL  Left lateral flexion WFL  Right rotation 80  Left rotation 80   (Blank rows = not tested)  UPPER EXTREMITY ROM: WFL for activities assessed    UPPER EXTREMITY MMT:  MMT Right eval Left eval  Shoulder flexion 4+/5 4+/5  Shoulder extension    Shoulder abduction 5/5 5/5  Shoulder adduction    Shoulder extension    Shoulder internal rotation    Shoulder external rotation    Middle trapezius    Lower trapezius    Elbow flexion 4+/5 4+/5  Elbow extension 5/5 5/5  Wrist flexion    Wrist extension    Wrist ulnar deviation    Wrist radial deviation    Wrist pronation    Wrist supination    Grip strength 95 95   (Blank rows = not tested)  CERVICAL SPECIAL TESTS:  Upper limb tension test (ULTT): Positive, Spurling's test: Negative, Distraction test: Negative, and Sharp pursor's test: Negative  TODAY'S TREATMENT:                                     7/31 EXERCISE LOG  Exercise Repetitions and Resistance Comments  UBE 60 RPMs x  10 mins (5 forward/backward)   Shoulder Rows Blue tband x 25 reps, RUE   Shoulder Extensions Blue tband x 25 reps, RUE   Shoulder Protractions Blue tband x 25 reps, RUE   Chopping wood Blue XTS x 25 reps, BIL            Blank cell = exercise not performed today   Manual Therapy Soft Tissue Mobilization: right deltoid, STW/M performed to right deltoid to decrease pain and tone   Modalities  Date:  Unattended Estim: Shoulder, Pre-mod 80-150 hz, 10 mins, Pain and Tone                                      7/25 EXERCISE LOG  Exercise Repetitions and Resistance Comments  Ulnar nerve glides Attempted, but aggravated familiar numbness   Upper trapezius stretch 2 x 30 seconds    Scapular depression Attempted, but aggravated familiar symptoms   Scapular retraction Attempted, but aggravated familiar symptoms        Blank cell = exercise not performed today    PATIENT EDUCATION:  Education details: nerve mobility,  Person educated: Patient Education method: Explanation Education comprehension: verbalized understanding   HOME EXERCISE PROGRAM: [id number]  ASSESSMENT:  CLINICAL IMPRESSION: Pt arrives for today's treatment session reporting 2-3/10 ulnar nerve pain in distal tricep region.  Pt able to tolerate introduction to UBE and standing resisted shoulder exercises without issue or complaint.  Pt requiring min cues for proper technique and posture.  STW/M performed to right deltoid and tricep region to decrease pain and tone. Normal responses to estim noted upon removal.  Pt reported 1-2/10 right ulnar pain at completion  of today's treatment session.    OBJECTIVE IMPAIRMENTS decreased activity tolerance, impaired sensation, impaired tone, impaired UE functional use, postural dysfunction, and pain.   ACTIVITY LIMITATIONS  none reported  PARTICIPATION LIMITATIONS: community activity  PERSONAL FACTORS 1 comorbidity: HTN  are also affecting patient's functional outcome.   REHAB  POTENTIAL: Good  CLINICAL DECISION MAKING: Evolving/moderate complexity  EVALUATION COMPLEXITY: Moderate   GOALS: Goals reviewed with patient? Yes  LONG TERM GOALS: Target date: 03/02/2022  Patient will be independent with his HEP.  Baseline:  Goal status: INITIAL  2.  Patient will be able to complete his daily activities without his familiar pain exceeding 2/10. Baseline: 4-5/10 Goal status: INITIAL  3.  Patient will be able to complete his critical job demands without reproducing his familiar right hand numbness.  Baseline:  Goal status: INITIAL   PLAN: PT FREQUENCY: 2x/week  PT DURATION: 4 weeks  PLANNED INTERVENTIONS: Therapeutic exercises, Therapeutic activity, Neuromuscular re-education, Patient/Family education, Self Care, Joint mobilization, Joint manipulation, Dry Needling, Electrical stimulation, Spinal manipulation, Spinal mobilization, Cryotherapy, Moist heat, Taping, Traction, Ultrasound, Manual therapy, and Re-evaluation  PLAN FOR NEXT SESSION: UBE, STM to right upper trapezius, and modalities as needed    Kathrynn Ducking, PTA 02/08/2022, 9:33 AM

## 2022-02-10 ENCOUNTER — Ambulatory Visit (INDEPENDENT_AMBULATORY_CARE_PROVIDER_SITE_OTHER): Payer: Managed Care, Other (non HMO) | Admitting: Family Medicine

## 2022-02-10 ENCOUNTER — Encounter: Payer: Self-pay | Admitting: Family Medicine

## 2022-02-10 ENCOUNTER — Other Ambulatory Visit: Payer: Self-pay | Admitting: Family Medicine

## 2022-02-10 VITALS — BP 146/91 | HR 64 | Temp 97.9°F | Ht 67.0 in | Wt 214.2 lb

## 2022-02-10 DIAGNOSIS — H6123 Impacted cerumen, bilateral: Secondary | ICD-10-CM

## 2022-02-10 DIAGNOSIS — N529 Male erectile dysfunction, unspecified: Secondary | ICD-10-CM

## 2022-02-10 DIAGNOSIS — M546 Pain in thoracic spine: Secondary | ICD-10-CM | POA: Diagnosis not present

## 2022-02-10 DIAGNOSIS — M5412 Radiculopathy, cervical region: Secondary | ICD-10-CM

## 2022-02-10 DIAGNOSIS — I10 Essential (primary) hypertension: Secondary | ICD-10-CM

## 2022-02-10 MED ORDER — AMLODIPINE BESY-BENAZEPRIL HCL 5-40 MG PO CAPS: 1.0000 | ORAL_CAPSULE | Freq: Every day | ORAL | 1 refills | Status: DC

## 2022-02-10 MED ORDER — DICLOFENAC SODIUM 75 MG PO TBEC: 75.0000 mg | DELAYED_RELEASE_TABLET | Freq: Two times a day (BID) | ORAL | 0 refills | Status: DC

## 2022-02-10 MED ORDER — METHOCARBAMOL 750 MG PO TABS: 750.0000 mg | ORAL_TABLET | Freq: Four times a day (QID) | ORAL | 1 refills | Status: DC

## 2022-02-10 MED ORDER — KETOROLAC TROMETHAMINE 60 MG/2ML IM SOLN
60.0000 mg | Freq: Once | INTRAMUSCULAR | Status: AC
  Administered 2022-02-10: 60 mg via INTRAMUSCULAR

## 2022-02-10 MED ORDER — HYDROCHLOROTHIAZIDE 12.5 MG PO CAPS: 12.5000 mg | ORAL_CAPSULE | Freq: Every day | ORAL | 1 refills | Status: DC

## 2022-02-10 NOTE — Patient Instructions (Signed)
Cervical Radiculopathy  Cervical radiculopathy happens when a nerve in the neck (a cervical nerve) is pinched or bruised. This condition can happen because of an injury to the cervical spine (vertebrae) in the neck, or as part of the normal aging process. Pressure on the cervical nerves can cause pain or numbness that travels from the neck all the way down to the arm and fingers. This condition usually gets better with rest. Treatment may be needed if the condition does not improve. What are the causes? This condition may be caused by: A neck injury. A bulging (herniated) disk. Muscle spasms. Muscle tightness in the neck due to overuse. Arthritis. Breakdown or degeneration in the bones and joints of the spine (spondylosis) due to aging. Bone spurs that may develop near the cervical nerves. What are the signs or symptoms? Symptoms of this condition include: Pain. The pain may travel from the neck to the arm and hand. The pain can be severe or irritating. It may get worse when you move your neck. Numbness or tingling in your arm or hand. Weakness in the affected arm and hand, in severe cases. How is this diagnosed? This condition may be diagnosed based on your symptoms, your medical history, and a physical exam. You may also have tests, including: X-rays. CT scan. MRI. Electromyogram (EMG). Nerve conduction tests. How is this treated? In many cases, treatment is not needed for this condition. With rest, the condition usually gets better over time. If treatment is needed, options may include: Wearing a soft neck collar (cervical collar) for short periods of time. Doing physical therapy to strengthen your neck muscles. Taking medicines. These may include NSAIDs, such as ibuprofen, or oral corticosteroids. Having spinal injections, in severe cases. Having surgery. This may be needed if other treatments do not help. Different types of surgery may be done depending on the cause of this  condition. Follow these instructions at home: If you have a cervical collar: Wear it as told by your health care provider. Remove it only as told by your health care provider. Ask your health care provider if you can remove the cervical collar for cleaning and bathing. If you are allowed to remove the collar for cleaning or bathing: Follow instructions from your health care provider about how to remove the collar safely. Clean the collar by wiping it with mild soap and water and drying it completely. Take out any removable pads in the collar every 1-2 days, and wash them by hand with soap and water. Let them air-dry completely before you put them back in the collar. Check your skin under the collar for irritation or sores. If you see any, tell your health care provider. Managing pain     Take over-the-counter and prescription medicines only as told by your health care provider. If directed, put ice on the affected area. To do this: If you have a soft neck collar, remove it as told by your health care provider. Put ice in a plastic bag. Place a towel between your skin and the bag. Leave the ice on for 20 minutes, 2-3 times a day. Remove the ice if your skin turns bright red. This is very important. If you cannot feel pain, heat, or cold, you have a greater risk of damage to the area. If applying ice does not help, you can try using heat. Use the heat source that your health care provider recommends, such as a moist heat pack or a heating pad. Place a towel between   your skin and the heat source. Leave the heat on for 20-30 minutes. Remove the heat if your skin turns bright red. This is especially important if you are unable to feel pain, heat, or cold. You have a greater risk of getting burned. Try a gentle neck and shoulder massage to help relieve symptoms. Activity Rest as needed. Return to your normal activities as told by your health care provider. Ask your health care provider what  activities are safe for you. Do stretching and strengthening exercises as told by your health care provider or your physical therapist. You may have to avoid lifting. Ask your health care provider how much you can safely lift. General instructions Use a flat pillow when you sleep. Do not drive while wearing a cervical collar. If you do not have a cervical collar, ask your health care provider if it is safe to drive while your neck heals. Ask your health care provider if the medicine prescribed to you requires you to avoid driving or using machinery. Do not use any products that contain nicotine or tobacco. These products include cigarettes, chewing tobacco, and vaping devices, such as e-cigarettes. If you need help quitting, ask your health care provider. Keep all follow-up visits. This is important. Contact a health care provider if: Your condition does not improve with treatment. Get help right away if: Your pain gets much worse and is not controlled with medicines. You have weakness or numbness in your hand, arm, face, or leg. You have a high fever. You have a stiff, rigid neck. You lose control of your bowels or your bladder (have incontinence). You have trouble with walking, balance, or speaking. Summary Cervical radiculopathy happens when a nerve in the neck is pinched or bruised. A nerve can get pinched from a bulging disk, arthritis, muscle spasms, or an injury to the neck. Symptoms include pain, tingling, or numbness radiating from the neck to the arm or hand. Weakness can also occur in severe cases. Treatment may include rest, wearing a cervical collar, and physical therapy. Medicines may be prescribed to help with pain. In severe cases, injections or surgery may be needed. This information is not intended to replace advice given to you by your health care provider. Make sure you discuss any questions you have with your health care provider. Document Revised: 01/01/2021 Document  Reviewed: 01/01/2021 Elsevier Patient Education  2023 [employer]  

## 2022-02-10 NOTE — Progress Notes (Signed)
Established Patient Office Visit  Subjective   Patient ID: Frederick Barr, male    DOB: [birth year]  Age: 56 y.o. MRN: [id number]  Chief Complaint  Patient presents with   cervical radiculopathy    HPI Frederick Barr is here for a follow up of HTN and cervical radiculopathy.   Cervical Radiculapothy He started PT last week. Reports some improvement in symptoms. Reports pain is intermittent and is a 4/10 when it occurs. It radiates down his right arm. The pain is aggravated by increased activity. He has tingling in his right hand and numbness is his 4th and 5th fingers. He has been taking mobic and flexeril. The flexeril does make him a little drowsy. He has also had a oral prednisone burst.   2. Thoracic back pain Pain in right upper back has improved and is no mild and intermittent. The particular spot is when the C8 nerve root is.   3. Ear congestion Reports a feeling of fullness and muffled right ear for 1 months. No pain or drainage. No fever. No URI symptoms.   4. ED Hx of ED. This worsened 2 weeks ago when amlodipine was increased. Reports that ED previously worsened with increased BP medications. He is able to achieve erection but has difficulty maintaining an erection.  5. HTN Amlodipine was increased from 5 to '10mg'$  daily at last visit. He has not been checking his BP regularly. Last time he checked it was 150/100. Denies chest pain, shortness of breath, edema, focal weakness.    Past Medical History:  Diagnosis Date   Headache    Hyperlipidemia    Hypertension    Seizures ([place])    as a teenager/ last seizure at age 62      ROS As per HPI.     Objective:     BP (!) 146/91   Pulse 64   Temp 97.9 F (36.6 C) (Temporal)   Ht '5\' 7"'$  (1.702 m)   Wt 214 lb 4 oz (97.2 kg)   SpO2 97%   BMI 33.56 kg/m    Physical Exam Vitals and nursing note reviewed.  Constitutional:      General: He is not in acute distress.    Appearance: He is not ill-appearing, toxic-appearing or  diaphoretic.  Cardiovascular:     Rate and Rhythm: Normal rate and regular rhythm.     Heart sounds: Normal heart sounds. No murmur heard. Pulmonary:     Effort: Pulmonary effort is normal. No respiratory distress.     Breath sounds: Normal breath sounds.  Musculoskeletal:     Right shoulder: No swelling, deformity, effusion, tenderness or bony tenderness. Normal strength.     Right upper arm: Normal.     Right forearm: Normal.     Right hand: Normal.     Cervical back: No edema, erythema or rigidity. No spinous process tenderness or muscular tenderness.     Thoracic back: Tenderness (right muscular) present. No swelling, edema, signs of trauma or bony tenderness.     Right lower leg: No edema.     Left lower leg: No edema.  Skin:    General: Skin is warm and dry.  Neurological:     General: No focal deficit present.     Mental Status: He is alert and oriented to person, place, and time.  Psychiatric:        Mood and Affect: Mood normal.        Behavior: Behavior normal.     Ear Cerumen Removal  Date/Time: 02/10/2022 11:15 AM  Performed by: Gwenlyn Perking, FNP Authorized by: Gwenlyn Perking, FNP   Anesthesia: Local Anesthetic: none Location details: right ear and left ear Patient tolerance: patient tolerated the procedure well with no immediate complications Comments: Normal TMs visualized following irrigation.  Procedure type: irrigation  Sedation: Patient sedated: no      No results found for any visits on 02/10/22.    The 10-year ASCVD risk score (Arnett DK, et al., 2019) is: 22.1%    Assessment & Plan:   Ector was seen today for cervical radiculopathy.  Diagnoses and all orders for this visit:  Cervical radiculopathy Acute right-sided thoracic back pain Some improvement, no red flags. Continue PT. Will switch to voltaren and robaxin. Toradol injection today in office, hold other NSAIDs today. Discussed further imaging if no improvement after 6 weeks  of conservative treatment and PT.  -     methocarbamol (ROBAXIN) 750 MG tablet; Take 1 tablet (750 mg total) by mouth 4 (four) times daily. -     diclofenac (VOLTAREN) 75 MG EC tablet; Take 1 tablet (75 mg total) by mouth 2 (two) times daily. -     ketorolac (TORADOL) injection 60 mg  Erectile dysfunction, unspecified erectile dysfunction type Worsened with increase in amlodipine. Decrease amlodipine back 5 mg daily. Discussed if no improvement with reduced dosage, can try Viagra if BP is controlled.   Primary hypertension Not quite at goal, but improved. Will decrease amlodipine back to 5 mg due to side effects with 10 mg dosage. Will add HCTZ today. Monitor BP at home and notify for persistent reading of 140/90 or greater.  -     amLODipine-benazepril (LOTREL) 5-40 MG capsule; Take 1 capsule by mouth daily. -     hydrochlorothiazide (MICROZIDE) 12.5 MG capsule; Take 1 capsule (12.5 mg total) by mouth daily.  Bilateral impacted cerumen Normal TMs visualized following irrigation.  -     Ear wax removal   Return in about 6 weeks (around 03/24/2022) for follow up with PCP.   The patient indicates understanding of these issues and agrees with the plan.  Gwenlyn Perking, FNP

## 2022-02-12 ENCOUNTER — Telehealth: Payer: Self-pay | Admitting: Family Medicine

## 2022-02-12 ENCOUNTER — Ambulatory Visit: Payer: Managed Care, Other (non HMO) | Attending: Family Medicine

## 2022-02-12 DIAGNOSIS — M5412 Radiculopathy, cervical region: Secondary | ICD-10-CM

## 2022-02-12 DIAGNOSIS — R208 Other disturbances of skin sensation: Secondary | ICD-10-CM | POA: Diagnosis present

## 2022-02-12 DIAGNOSIS — M79601 Pain in right arm: Secondary | ICD-10-CM | POA: Insufficient documentation

## 2022-02-12 NOTE — Therapy (Signed)
OUTPATIENT PHYSICAL THERAPY CERVICAL TREATMENT NOTE   Patient Name: Frederick Barr MRN: [id number] DOB:1966-04-26, 56 y.o., male Today's Date: 02/12/2022   PT End of Session - 02/12/22 0817     Visit Number 3    Number of Visits 8    Date for PT Re-Evaluation 04/09/22    PT Start Time 0815    PT Stop Time 0905    PT Time Calculation (min) 50 min    Activity Tolerance Patient tolerated treatment well    Behavior During Therapy [facility] for tasks assessed/performed              Past Medical History:  Diagnosis Date   Headache    Hyperlipidemia    Hypertension    Seizures ([place])    as a teenager/ last seizure at age 57   Past Surgical History:  Procedure Laterality Date   COLONOSCOPY  10/06/2017   Dr.Pyrtle   COLONOSCOPY  2015   HEMORRHOID SURGERY  2012   excised overlying skin on prolapsed internal hemorrhoids and evacuated a large number of clots.      POLYPECTOMY     Patient Active Problem List   Diagnosis Date Noted   GERD (gastroesophageal reflux disease) 10/13/2018   Tobacco use 06/27/2018   Vitamin D deficiency 09/06/2014   Hyperlipidemia 03/06/2014   Benign prostatic hyperplasia without lower urinary tract symptoms 03/06/2014   Family history of heart disease 03/06/2014   Hypertension 09/11/2013    PCP: Dettinger, Fransisca Kaufmann, MD   REFERRING PROVIDER: Gwenlyn Perking, FNP  REFERRING DIAG: Cervical radiculopathy; Thoracic radiculopathy  THERAPY DIAG:  Pain in right arm  Other disturbances of skin sensation  Rationale for Evaluation and Treatment Rehabilitation  ONSET DATE: about 3 weeks ago  SUBJECTIVE:                                                                                                                                                                                                         SUBJECTIVE STATEMENT: Patient reports that his hand swelled up after his last appointment. However, it was better yesterday and is "normal" today as he is  still having the numbness into his arm.   PERTINENT HISTORY:  HTN  PAIN:  Are you having pain? Yes: NPRS scale: 0/10 Pain location: right shoulder and arm Pain description: ache Aggravating factors: none found Relieving factors: getting up and moving  PRECAUTIONS: None  WEIGHT BEARING RESTRICTIONS No  FALLS:  Has patient fallen in last 6 months? 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  NEXT MD FOLLOW UP: 02/10/22  PLOF: Independent  PATIENT GOALS reduced pain and numbness  OBJECTIVE: all objective measures were completed at his initial evaluation on 02/02/22 unless otherwise noted  PATIENT SURVEYS:  FOTO to be completed at follow up   COGNITION: Overall cognitive status: Within functional limits for tasks assessed   SENSATION: Light touch: Impaired  Diminished sensation along right C8 dermatome  POSTURE: rounded shoulders and forward head  PALPATION: TTP: right upper trapezius and right scapular retractors   CERVICAL ROM:   Active ROM A/PROM (deg) eval  Flexion 40  Extension 54  Right lateral flexion WFL  Left lateral flexion WFL  Right rotation 80  Left rotation 80   (Blank rows = not tested)  UPPER EXTREMITY ROM: WFL for activities assessed    UPPER EXTREMITY MMT:  MMT Right eval Left eval  Shoulder flexion 4+/5 4+/5  Shoulder extension    Shoulder abduction 5/5 5/5  Shoulder adduction    Shoulder extension    Shoulder internal rotation    Shoulder external rotation    Middle trapezius    Lower trapezius    Elbow flexion 4+/5 4+/5  Elbow extension 5/5 5/5  Wrist flexion    Wrist extension    Wrist ulnar deviation    Wrist radial deviation    Wrist pronation    Wrist supination    Grip strength 95 95   (Blank rows = not tested)  CERVICAL SPECIAL TESTS:  Upper limb tension test (ULTT): Positive, Spurling's test: Negative, Distraction  test: Negative, and Sharp pursor's test: Negative  TODAY'S TREATMENT:                                    8/4 EXERCISE LOG  Manual Therapy Soft Tissue Mobilization: right upper trapezius, scapular stabilizers, and infraspinatus, attempted for reduced pain, but aggravated his familiar symptoms Manual Traction: cervical, slightly reduced his familiar pain   Modalities  Date:  Traction: cervical, 14# max, 5# min, 99 second hold, and 5 second relief, 15 mins mins Unattended Estim: right scapular stabilizers and infraspinatus, IFC @ 80-150 Hz w/ 40% scan, 10 mins, Pain                                     7/31 EXERCISE LOG  Exercise Repetitions and Resistance Comments  UBE 60 RPMs x 10 mins (5 forward/backward)   Shoulder Rows Blue tband x 25 reps, RUE   Shoulder Extensions Blue tband x 25 reps, RUE   Shoulder Protractions Blue tband x 25 reps, RUE   Chopping wood Blue XTS x 25 reps, BIL            Blank cell = exercise not performed today   Manual Therapy Soft Tissue Mobilization: right deltoid, STW/M performed to right deltoid to decrease pain and tone   Modalities  Date:  Unattended Estim: Shoulder, Pre-mod 80-150 hz, 10 mins, Pain and Tone                                      7/25 EXERCISE LOG  Exercise Repetitions and Resistance Comments  Ulnar nerve glides Attempted, but aggravated familiar numbness   Upper trapezius stretch 2  x 30 seconds    Scapular depression Attempted, but aggravated familiar symptoms   Scapular retraction Attempted, but aggravated familiar symptoms        Blank cell = exercise not performed today    PATIENT EDUCATION:  Education details: nerve mobility,  Person educated: Patient Education method: Explanation Education comprehension: verbalized understanding   HOME EXERCISE PROGRAM: [id number]  ASSESSMENT:  CLINICAL IMPRESSION: Patient presented to treatment reporting increased pain and numbness after his last appointment. Manual therapy  was attempted with light cervical distraction being the most effective at reducing his familiar symptoms. Mechanical traction was attempted with this being minimally effective at reducing his familiar symptoms. He reported feeling better after electrical stimulation upon the conclusion of treatment. He continues to require skilled physical therapy to address his remaining impairments to return to his prior level of function.    OBJECTIVE IMPAIRMENTS decreased activity tolerance, impaired sensation, impaired tone, impaired UE functional use, postural dysfunction, and pain.   ACTIVITY LIMITATIONS  none reported  PARTICIPATION LIMITATIONS: community activity  PERSONAL FACTORS 1 comorbidity: HTN  are also affecting patient's functional outcome.   REHAB POTENTIAL: Good  CLINICAL DECISION MAKING: Evolving/moderate complexity  EVALUATION COMPLEXITY: Moderate   GOALS: Goals reviewed with patient? Yes  LONG TERM GOALS: Target date: 03/02/2022  Patient will be independent with his HEP.  Baseline:  Goal status: INITIAL  2.  Patient will be able to complete his daily activities without his familiar pain exceeding 2/10. Baseline: 4-5/10 Goal status: INITIAL  3.  Patient will be able to complete his critical job demands without reproducing his familiar right hand numbness.  Baseline:  Goal status: INITIAL   PLAN: PT FREQUENCY: 2x/week  PT DURATION: 4 weeks  PLANNED INTERVENTIONS: Therapeutic exercises, Therapeutic activity, Neuromuscular re-education, Patient/Family education, Self Care, Joint mobilization, Joint manipulation, Dry Needling, Electrical stimulation, Spinal manipulation, Spinal mobilization, Cryotherapy, Moist heat, Taping, Traction, Ultrasound, Manual therapy, and Re-evaluation  PLAN FOR NEXT SESSION: UBE, STM to right upper trapezius, and modalities as needed    Darlin Coco, PT 02/12/2022, 11:48 AM

## 2022-02-12 NOTE — Telephone Encounter (Signed)
Tiffany Lilia Pro made changes to BP meds on 8/2 visit.  Pt aware to take '5mg'$  of Amlodipine and also aware that she ordered HCTZ as well. Pt also takes Propranolol  '20mg'$  for BP and wants to make sure he is supposed to be on all of them.  Pt states that when he went to the pharmacy that he has an Rx for Diclofenac and Meloxicam. He was told by pharmacy that he should not take both. Pharmacy told pt that the meloxicam was probably stronger.  Will route to Tiffany to address. Pt aware that it may be Monday before it is addressed since it it after 4:30 pm now.

## 2022-02-16 ENCOUNTER — Ambulatory Visit: Payer: Managed Care, Other (non HMO)

## 2022-02-16 DIAGNOSIS — M79601 Pain in right arm: Secondary | ICD-10-CM | POA: Diagnosis not present

## 2022-02-16 DIAGNOSIS — R208 Other disturbances of skin sensation: Secondary | ICD-10-CM

## 2022-02-16 NOTE — Therapy (Signed)
OUTPATIENT PHYSICAL THERAPY CERVICAL TREATMENT NOTE   Patient Name: Frederick Barr MRN: [id number] DOB:[birth year], 56 y.o., male Today's Date: 02/16/2022   PT End of Session - 02/16/22 0733     Visit Number 4    Number of Visits 8    Date for PT Re-Evaluation 04/09/22    PT Start Time 0728    PT Stop Time 0814    PT Time Calculation (min) 46 min    Activity Tolerance Patient tolerated treatment well    Behavior During Therapy [facility] for tasks assessed/performed               Past Medical History:  Diagnosis Date   Headache    Hyperlipidemia    Hypertension    Seizures ([place])    as a teenager/ last seizure at age 1   Past Surgical History:  Procedure Laterality Date   COLONOSCOPY  10/06/2017   Dr.Pyrtle   COLONOSCOPY  2015   HEMORRHOID SURGERY  2012   excised overlying skin on prolapsed internal hemorrhoids and evacuated a large number of clots.      POLYPECTOMY     Patient Active Problem List   Diagnosis Date Noted   GERD (gastroesophageal reflux disease) 10/13/2018   Tobacco use 06/27/2018   Vitamin D deficiency 09/06/2014   Hyperlipidemia 03/06/2014   Benign prostatic hyperplasia without lower urinary tract symptoms 03/06/2014   Family history of heart disease 03/06/2014   Hypertension 09/11/2013    PCP: Dettinger, Fransisca Kaufmann, MD   REFERRING PROVIDER: Gwenlyn Perking, FNP  REFERRING DIAG: Cervical radiculopathy; Thoracic radiculopathy  THERAPY DIAG:  Pain in right arm  Other disturbances of skin sensation  Rationale for Evaluation and Treatment Rehabilitation  ONSET DATE: about 3 weeks ago  SUBJECTIVE:                                                                                                                                                                                                         SUBJECTIVE STATEMENT: Patient reports that he felt "ok" after his last appointment. However, his arm is still numb "off and on" and he had a flare up in  his pain yesterday.   PERTINENT HISTORY:  HTN  PAIN:  Are you having pain? Yes: NPRS scale: 1/10 Pain location: right shoulder and arm Pain description: ache Aggravating factors: none found Relieving factors: getting up and moving  PRECAUTIONS: None  WEIGHT BEARING RESTRICTIONS No  FALLS:  Has patient fallen in last 6 months? 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  NEXT MD FOLLOW UP: 02/10/22  PLOF: Independent  PATIENT GOALS reduced pain and numbness  OBJECTIVE: all objective measures were completed at his initial evaluation on 02/02/22 unless otherwise noted  PATIENT SURVEYS:  FOTO to be completed at follow up   COGNITION: Overall cognitive status: Within functional limits for tasks assessed   SENSATION: Light touch: Impaired  Diminished sensation along right C8 dermatome  POSTURE: rounded shoulders and forward head  PALPATION: TTP: right upper trapezius and right scapular retractors   CERVICAL ROM:   Active ROM A/PROM (deg) eval  Flexion 40  Extension 54  Right lateral flexion WFL  Left lateral flexion WFL  Right rotation 80  Left rotation 80   (Blank rows = not tested)  UPPER EXTREMITY ROM: WFL for activities assessed    UPPER EXTREMITY MMT:  MMT Right eval Left eval  Shoulder flexion 4+/5 4+/5  Shoulder extension    Shoulder abduction 5/5 5/5  Shoulder adduction    Shoulder extension    Shoulder internal rotation    Shoulder external rotation    Middle trapezius    Lower trapezius    Elbow flexion 4+/5 4+/5  Elbow extension 5/5 5/5  Wrist flexion    Wrist extension    Wrist ulnar deviation    Wrist radial deviation    Wrist pronation    Wrist supination    Grip strength 95 95   (Blank rows = not tested)  CERVICAL SPECIAL TESTS:  Upper limb tension test (ULTT): Positive, Spurling's test: Negative, Distraction test: Negative, and  Sharp pursor's test: Negative  TODAY'S TREATMENT:                                    8/8 EXERCISE LOG  Exercise Repetitions and Resistance Comments  UBE  X 6.5 minutes @ 120 RPM  Began to aggravate his familiar symptoms  Rows  Blue XTS; 2 x 15 reps    Isometric ER 30 reps w/ 5 second hold    Scapular depression 25 reps        Blank cell = exercise not performed today  Manual Therapy Soft Tissue Mobilization: right upper trapezius and infraspinatus, for reduced pain with minimal effectiveness Manual Traction: cervical, for pain relief with moderate effectiveness   Modalities  Date:  Unattended Estim: right scapular stabilizers and infraspinatus, IFC @ 80-150 Hz w/ 40% scan, 15 mins, Pain                                   8/4 EXERCISE LOG  Manual Therapy Soft Tissue Mobilization: right upper trapezius, scapular stabilizers, and infraspinatus, attempted for reduced pain, but aggravated his familiar symptoms Manual Traction: cervical, slightly reduced his familiar pain   Modalities  Date:  Traction: cervical, 14# max, 5# min, 99 second hold, and 5 second relief, 15 mins mins Unattended Estim: right scapular stabilizers and infraspinatus, IFC @ 80-150 Hz w/ 40% scan, 10 mins, Pain                                     7/31 EXERCISE LOG  Exercise Repetitions and Resistance Comments  UBE 60 RPMs x 10 mins (5 forward/backward)   Shoulder Rows Blue tband  x 25 reps, RUE   Shoulder Extensions Blue tband x 25 reps, RUE   Shoulder Protractions Blue tband x 25 reps, RUE   Chopping wood Blue XTS x 25 reps, BIL            Blank cell = exercise not performed today   Manual Therapy Soft Tissue Mobilization: right deltoid, STW/M performed to right deltoid to decrease pain and tone   Modalities  Date:  Unattended Estim: Shoulder, Pre-mod 80-150 hz, 10 mins, Pain and Tone   PATIENT EDUCATION:  Education details: nerve mobility,  Person educated: Patient Education method:  Explanation Education comprehension: verbalized understanding   HOME EXERCISE PROGRAM: [id number]  ASSESSMENT:  CLINICAL IMPRESSION: Patient was reintroduced to familiar interventions for improved scapulothoracic mobility and stability. However, he exhibited moderate to high pain irritability which resulted in activities such as the UBE being stopped due to increased pain. Cervical distraction was the most effective at reducing his familiar symptoms. He reported feeling better upon the conclusion of treatment. He continues to require skilled physical therapy to address his remaining impairments to return to his prior level of function.    OBJECTIVE IMPAIRMENTS decreased activity tolerance, impaired sensation, impaired tone, impaired UE functional use, postural dysfunction, and pain.   ACTIVITY LIMITATIONS  none reported  PARTICIPATION LIMITATIONS: community activity  PERSONAL FACTORS 1 comorbidity: HTN  are also affecting patient's functional outcome.   REHAB POTENTIAL: Good  CLINICAL DECISION MAKING: Evolving/moderate complexity  EVALUATION COMPLEXITY: Moderate   GOALS: Goals reviewed with patient? Yes  LONG TERM GOALS: Target date: 03/02/2022  Patient will be independent with his HEP.  Baseline:  Goal status: INITIAL  2.  Patient will be able to complete his daily activities without his familiar pain exceeding 2/10. Baseline: 4-5/10 Goal status: INITIAL  3.  Patient will be able to complete his critical job demands without reproducing his familiar right hand numbness.  Baseline:  Goal status: INITIAL   PLAN: PT FREQUENCY: 2x/week  PT DURATION: 4 weeks  PLANNED INTERVENTIONS: Therapeutic exercises, Therapeutic activity, Neuromuscular re-education, Patient/Family education, Self Care, Joint mobilization, Joint manipulation, Dry Needling, Electrical stimulation, Spinal manipulation, Spinal mobilization, Cryotherapy, Moist heat, Taping, Traction, Ultrasound, Manual  therapy, and Re-evaluation  PLAN FOR NEXT SESSION: UBE, STM to right upper trapezius, and modalities as needed    Darlin Coco, PT 02/16/2022, 8:38 AM

## 2022-02-17 MED ORDER — MELOXICAM 15 MG PO TABS: 15.0000 mg | ORAL_TABLET | Freq: Every day | ORAL | 1 refills | Status: DC

## 2022-02-17 NOTE — Telephone Encounter (Signed)
Yes it is ok to take all of the BP medications together. I will send in a refill of meloxicam and discontinue the voltren. And correct, meloxicam and voltaren cannot be taken together.

## 2022-02-17 NOTE — Telephone Encounter (Signed)
Pt aware.

## 2022-02-18 ENCOUNTER — Ambulatory Visit (INDEPENDENT_AMBULATORY_CARE_PROVIDER_SITE_OTHER): Payer: Managed Care, Other (non HMO) | Admitting: Family

## 2022-02-18 ENCOUNTER — Encounter: Payer: Self-pay | Admitting: Family

## 2022-02-18 VITALS — BP 138/92 | HR 73 | Temp 97.0°F | Ht 67.0 in | Wt 218.0 lb

## 2022-02-18 DIAGNOSIS — M546 Pain in thoracic spine: Secondary | ICD-10-CM | POA: Diagnosis not present

## 2022-02-18 DIAGNOSIS — M5414 Radiculopathy, thoracic region: Secondary | ICD-10-CM | POA: Diagnosis not present

## 2022-02-18 MED ORDER — CYCLOBENZAPRINE HCL 10 MG PO TABS: 10.0000 mg | ORAL_TABLET | Freq: Three times a day (TID) | ORAL | 0 refills | Status: DC | PRN

## 2022-02-18 MED ORDER — METHYLPREDNISOLONE ACETATE 80 MG/ML IJ SUSP
80.0000 mg | Freq: Once | INTRAMUSCULAR | Status: AC
  Administered 2022-02-18: 80 mg via INTRAMUSCULAR

## 2022-02-18 MED ORDER — PREDNISONE 10 MG PO TABS: ORAL_TABLET | ORAL | 0 refills | Status: DC

## 2022-02-18 MED ORDER — KETOROLAC TROMETHAMINE 60 MG/2ML IM SOLN
60.0000 mg | Freq: Once | INTRAMUSCULAR | Status: AC
  Administered 2022-02-18: 60 mg via INTRAMUSCULAR

## 2022-02-18 MED ORDER — MELOXICAM 15 MG PO TABS
15.0000 mg | ORAL_TABLET | Freq: Every day | ORAL | 1 refills | Status: DC
Start: 2022-02-18 — End: 2022-08-30

## 2022-02-18 NOTE — Progress Notes (Signed)
Subjective:    Patient ID: Frederick Barr, male    DOB: November 09, 1965, 56 y.o.   MRN: [id number] Chief Complaint  Patient presents with   Pain    WAS SEEN COUPLE OF WEEKS AGO BEEN GOING TO PT SEEMS TO HAVE A MADE IT WORSE    Pt present to the office today with recurrent cervical radiculopathy that started last month. He has been doing PT without relief. He was given Flexeril, mobic , and prednisone with mild relief.  Back Pain This is a new problem. The current episode started more than 1 month ago. The problem occurs constantly. The problem has been gradually worsening since onset. The pain is present in the thoracic spine. The quality of the pain is described as shooting and stabbing. Radiates to: right arm. The pain is at a severity of 8/10. The pain is moderate. The symptoms are aggravated by twisting. He has tried muscle relaxant, NSAIDs and home exercises for the symptoms. The treatment provided mild relief.      Review of Systems  Musculoskeletal:  Positive for back pain.  All other systems reviewed and are negative.      Objective:   Physical Exam Vitals reviewed.  Constitutional:      General: He is not in acute distress.    Appearance: He is well-developed.  HENT:     Head: Normocephalic.     Right Ear: Tympanic membrane normal.     Left Ear: Tympanic membrane normal.  Eyes:     General:        Right eye: No discharge.        Left eye: No discharge.     Pupils: Pupils are equal, round, and reactive to light.  Neck:     Thyroid: No thyromegaly.  Cardiovascular:     Rate and Rhythm: Normal rate and regular rhythm.     Heart sounds: Normal heart sounds. No murmur heard. Pulmonary:     Effort: Pulmonary effort is normal. No respiratory distress.     Breath sounds: Normal breath sounds. No wheezing.  Abdominal:     General: Bowel sounds are normal. There is no distension.     Palpations: Abdomen is soft.     Tenderness: There is no abdominal tenderness.   Musculoskeletal:        General: No tenderness. Normal range of motion.     Cervical back: Normal range of motion and neck supple.     Comments: Full ROM of bilateral hands and strength  Skin:    General: Skin is warm and dry.     Findings: No erythema or rash.  Neurological:     Mental Status: He is alert and oriented to person, place, and time.     Cranial Nerves: No cranial nerve deficit.     Deep Tendon Reflexes: Reflexes are normal and symmetric.  Psychiatric:        Behavior: Behavior normal.        Thought Content: Thought content normal.        Judgment: Judgment normal.      BP (!) 138/92   Pulse 73   Temp (!) 97 F (36.1 C) (Temporal)   Ht '5\' 7"'$  (1.702 m)   Wt 218 lb (98.9 kg)   BMI 34.14 kg/m       Assessment & Plan:  Frederick Barr comes in today with chief complaint of Pain (WAS SEEN COUPLE OF WEEKS AGO BEEN GOING TO PT SEEMS TO HAVE A MADE IT  WORSE )   Diagnosis and orders addressed:  1. Acute right-sided thoracic back pain - methylPREDNISolone acetate (DEPO-MEDROL) injection 80 mg - ketorolac (TORADOL) injection 60 mg - cyclobenzaprine (FLEXERIL) 10 MG tablet; Take 1 tablet (10 mg total) by mouth 3 (three) times daily as needed for muscle spasms.  Dispense: 30 tablet; Refill: 0 - predniSONE (DELTASONE) 10 MG tablet; Days 1-4 take 4 tablets (40 mg) daily  Days 5-8 take 3 tablets (30 mg) daily, Days 9-11 take 2 tablets (20 mg) daily, Days 12-14 take 1 tablet (10 mg) daily.  Dispense: 37 tablet; Refill: 0  2. Thoracic radiculopathy - cyclobenzaprine (FLEXERIL) 10 MG tablet; Take 1 tablet (10 mg total) by mouth 3 (three) times daily as needed for muscle spasms.  Dispense: 30 tablet; Refill: 0 - predniSONE (DELTASONE) 10 MG tablet; Days 1-4 take 4 tablets (40 mg) daily  Days 5-8 take 3 tablets (30 mg) daily, Days 9-11 take 2 tablets (20 mg) daily, Days 12-14 take 1 tablet (10 mg) daily.  Dispense: 37 tablet; Refill: 0 - meloxicam (MOBIC) 15 MG tablet; Take 1  tablet (15 mg total) by mouth daily.  Dispense: 90 tablet; Refill: 1   Start oral prednisone tomorrow Continue with PT Continue Mobic, no other NSAID's  Follow up if pain worsen or does not improve    Evelina Dun, FNP

## 2022-02-18 NOTE — Patient Instructions (Signed)
Radicular Pain Radicular pain is a type of pain that spreads from your back or neck along a spinal nerve. Spinal nerves are nerves that leave the spinal cord and go to the muscles. Radicular pain is sometimes called radiculopathy, radiculitis, or a pinched nerve. When you have this type of pain, you may also have weakness, numbness, or tingling in the area of your body that is supplied by the nerve. The pain may feel sharp and burning. Depending on which spinal nerve is affected, the pain may occur in the: Neck area (cervical radicular pain). You may also feel pain, numbness, weakness, or tingling in the arms. Mid-spine area (thoracic radicular pain). You would feel this pain in the back and chest. This type is rare. Lower back area (lumbar radicular pain). You would feel this pain as low back pain. You may feel pain, numbness, weakness, or tingling in the buttocks or legs. Sciatica is a type of lumbar radicular pain that shoots down the back of the leg. Radicular pain occurs when one of the spinal nerves becomes irritated or squeezed (compressed). It is often caused by something pushing on a spinal nerve, such as one of the bones of the spine (vertebrae) or one of the round cushions between vertebrae (intervertebral disks). This can result from: An injury. Wear and tear or aging of a disk. The growth of a bone spur that pushes on the nerve. Radicular pain often goes away when you follow instructions from your health care provider for relieving pain at home. How is this treated? Treatment may depend on the cause of the condition and may include: Working with a physical therapist. Taking pain medicine. Applying heat or ice or both to the affected areas. Doing stretches to improve flexibility. Having surgery. This may be needed if other treatments do not help. Different types of surgery may be done depending on the cause of this condition. Follow these instructions at home: Managing pain     If  directed, put ice on the affected area. To do this: Put ice in a plastic bag. Place a towel between your skin and the bag. Leave the ice on for 20 minutes, 2-3 times a day. Remove the ice if your skin turns bright red. This is very important. If you cannot feel pain, heat, or cold, you have a greater risk of damage to the area. If directed, apply heat to the affected area as often as told by your health care provider. Use the heat source that your health care provider recommends, such as a moist heat pack or a heating pad. Place a towel between your skin and the heat source. Leave the heat on for 20-30 minutes. Remove the heat if your skin turns bright red. This is especially important if you are unable to feel pain, heat, or cold. You have a greater risk of getting burned. Activity Do not sit or rest in bed for long periods of time. Try to stay as active as possible. Ask your health care provider what type of exercise or activity is best for you. Avoid activities that make your pain worse, such as bending and lifting. You may have to avoid lifting. Ask your health care provider how much you can safely lift. Practice using proper technique when lifting items. Proper lifting technique involves bending your knees and rising up. Do strength and range-of-motion exercises only as told by your health care provider or physical therapist. General instructions Take over-the-counter and prescription medicines only as told by your   health care provider. Pay attention to any changes in your symptoms. Keep all follow-up visits. This is important. Contact a health care provider if: Your pain and other symptoms get worse. Your pain medicine is not helping. Your pain has not improved after a few weeks of home care. You have a fever. Get help right away if: You have severe pain, weakness, or numbness. You have difficulty with bladder or bowel control. Summary Radicular pain is a type of pain that spreads  from your back or neck along a spinal nerve. When you have radicular pain, you may also have weakness, numbness, or tingling in the area of your body that is supplied by the nerve. The pain may feel sharp or burning. Radicular pain may be treated with ice, heat, medicines, or physical therapy. This information is not intended to replace advice given to you by your health care provider. Make sure you discuss any questions you have with your health care provider. Document Revised: 01/01/2021 Document Reviewed: 01/01/2021 Elsevier Patient Education  2023 [employer]  

## 2022-02-19 ENCOUNTER — Encounter: Payer: Managed Care, Other (non HMO) | Admitting: Physical Therapy

## 2022-02-23 ENCOUNTER — Encounter: Payer: Self-pay | Admitting: Physical Therapy

## 2022-02-23 ENCOUNTER — Telehealth: Payer: Self-pay | Admitting: Family Medicine

## 2022-02-23 ENCOUNTER — Ambulatory Visit: Payer: Managed Care, Other (non HMO) | Admitting: Physical Therapy

## 2022-02-23 DIAGNOSIS — M79601 Pain in right arm: Secondary | ICD-10-CM | POA: Diagnosis not present

## 2022-02-23 DIAGNOSIS — R208 Other disturbances of skin sensation: Secondary | ICD-10-CM

## 2022-02-23 DIAGNOSIS — M5412 Radiculopathy, cervical region: Secondary | ICD-10-CM

## 2022-02-23 NOTE — Therapy (Addendum)
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      POLYPECTOMY     Patient Active Problem List   Diagnosis Date Noted   GERD (gastroesophageal reflux disease) 10/13/2018   Tobacco use 06/27/2018   Vitamin D deficiency 09/06/2014   Hyperlipidemia 03/06/2014   Benign prostatic hyperplasia without lower urinary tract symptoms 03/06/2014   Family history of heart disease 03/06/2014   Hypertension 09/11/2013    PCP: Dettinger, Fransisca Kaufmann, MD   REFERRING PROVIDER: Gwenlyn Perking, FNP  REFERRING DIAG: Cervical radiculopathy; Thoracic radiculopathy  THERAPY DIAG:  Pain in right arm  Other disturbances of skin sensation  Rationale for Evaluation and Treatment Rehabilitation  ONSET DATE: about 3 weeks ago  SUBJECTIVE:                                                                                                                                                                                                         SUBJECTIVE STATEMENT: Reports having a lot of pain last week even after a limited therex treatment but lost two days of work and had an emergency MD visit.  States that he has a big project coming up at work this week but does not want any exercise. Stated that traction did help when attempted before. Was supposed to have a referral from PCP to orthopedic but does not have one yet.  PERTINENT HISTORY:  HTN  PAIN:  Are you having pain? Yes: NPRS scale: 7-8/10 Pain location: right shoulder and arm Pain description: ache Aggravating  factors: none found Relieving factors: supine or reclined  PRECAUTIONS: None  WEIGHT BEARING RESTRICTIONS No  OCCUPATION: about 50% standing and 50% at desk  NEXT MD FOLLOW UP: TBD   OBJECTIVE:   PATIENT SURVEYS:  FOTO to be completed at follow up  TODAY'S TREATMENT:  Modalities  Date: 02/23/2022 Traction: cervical, 13# max/5# min, 15 mins mins Unattended Estim: Cervical and Shoulder, IFC 80-150 hz, 15 mins, Pain Combo: R scapular retractor, 1.5 w/cm2, 100%, 3 mins, Pain *Combo stopped due to reported RUE symptoms and pain   PATIENT EDUCATION:  Education details: nerve mobility,  Person educated: Patient Education method: Explanation Education comprehension: verbalized understanding   HOME EXERCISE PROGRAM: [id number]  ASSESSMENT:  CLINICAL IMPRESSION: Patient presented in clinic after seeing PCP for exacerbated symptoms in last PT session. Patient returned to PCP for pain injection and antiinflammatories. Patient very cautious with treatment due to the pain experienced from PT in the previous sessions. No therex completed per patient request due to pain. Combo was initially attempted but stopped to R scapular retractors due to pain and RUE symptoms per patient report after three minutes. Electrical stimulation was reported as helpful with pain as well as traction and consented to trying traction again. 13# max cervical traction initiated with no complaints via patient. Only relieving positions patient finds is lying supine or reclined currently. Patient reports that in the last few days driving has  become painful as well as mowing his lawn with a regular lawnmower as well as weedeating. Patient is ultimately waiting to be able to get an MRI. Normal stimulation response noted following of the modality. No pain after removal of the stimulation but stated that pain would resume as he stood.   OBJECTIVE IMPAIRMENTS decreased activity tolerance, impaired sensation, impaired tone, impaired UE functional use, postural dysfunction, and pain.   ACTIVITY LIMITATIONS  none reported  PARTICIPATION LIMITATIONS: community activity  PERSONAL FACTORS 1 comorbidity: HTN  are also affecting patient's functional outcome.   REHAB POTENTIAL: Good  CLINICAL DECISION MAKING: Evolving/moderate complexity  EVALUATION COMPLEXITY: Moderate   GOALS: Goals reviewed with patient? Yes  LONG TERM GOALS: Target date: 03/02/2022  Patient will be independent with his HEP.  Baseline:  Goal status: On-going  2.  Patient will be able to complete his daily activities without his familiar pain exceeding 2/10. Baseline: 4-5/10 Goal status: On-going due to pain  3.  Patient will be able to complete his critical job demands without reproducing his familiar right hand numbness.  Baseline:  Goal status: On-going due to pain   PLAN: PT FREQUENCY: 2x/week  PT DURATION: 4 weeks  PLANNED INTERVENTIONS: Therapeutic exercises, Therapeutic activity, Neuromuscular re-education, Patient/Family education, Self Care, Joint mobilization, Joint manipulation, Dry Needling, Electrical stimulation, Spinal manipulation, Spinal mobilization, Cryotherapy, Moist heat, Taping, Traction, Ultrasound, Manual therapy, and Re-evaluation  PLAN FOR NEXT SESSION: Assess symptoms and pain to determine modalities.   Standley Brooking, PTA 02/23/2022, 9:17 AM

## 2022-02-23 NOTE — Telephone Encounter (Signed)
Called and spoke patient he wants to see Dulcy Fanny in [place]

## 2022-02-23 NOTE — Telephone Encounter (Signed)
Pt said a referral was to be ordered at his last office visit (02/18/2022) to see an orthopedic.

## 2022-02-25 NOTE — Telephone Encounter (Signed)
Referral sent Ortho

## 2022-03-04 ENCOUNTER — Other Ambulatory Visit: Payer: Self-pay | Admitting: Family Medicine

## 2022-03-04 DIAGNOSIS — K219 Gastro-esophageal reflux disease without esophagitis: Secondary | ICD-10-CM

## 2022-03-05 ENCOUNTER — Encounter: Payer: Self-pay | Admitting: *Deleted

## 2022-03-05 ENCOUNTER — Ambulatory Visit: Payer: Managed Care, Other (non HMO) | Admitting: *Deleted

## 2022-03-05 DIAGNOSIS — M79601 Pain in right arm: Secondary | ICD-10-CM | POA: Diagnosis not present

## 2022-03-05 DIAGNOSIS — R208 Other disturbances of skin sensation: Secondary | ICD-10-CM

## 2022-03-05 NOTE — Therapy (Addendum)
OUTPATIENT PHYSICAL THERAPY CERVICAL TREATMENT NOTE   Patient Name: Frederick Barr MRN: [id number] DOB:06/15/1966, 56 y.o., male Today's Date: 03/05/2022   PT End of Session - 03/05/22 0843     Visit Number 6    Number of Visits 8    Date for PT Re-Evaluation 04/09/22    PT Start Time 0816    PT Stop Time 0908    PT Time Calculation (min) 52 min               Past Medical History:  Diagnosis Date   Headache    Hyperlipidemia    Hypertension    Seizures ([place])    as a teenager/ last seizure at age 21   Past Surgical History:  Procedure Laterality Date   COLONOSCOPY  10/06/2017   Dr.Pyrtle   COLONOSCOPY  2015   HEMORRHOID SURGERY  2012   excised overlying skin on prolapsed internal hemorrhoids and evacuated a large number of clots.      POLYPECTOMY     Patient Active Problem List   Diagnosis Date Noted   GERD (gastroesophageal reflux disease) 10/13/2018   Tobacco use 06/27/2018   Vitamin D deficiency 09/06/2014   Hyperlipidemia 03/06/2014   Benign prostatic hyperplasia without lower urinary tract symptoms 03/06/2014   Family history of heart disease 03/06/2014   Hypertension 09/11/2013    PCP: Dettinger, Fransisca Kaufmann, MD   REFERRING PROVIDER: Gwenlyn Perking, FNP  REFERRING DIAG: Cervical radiculopathy; Thoracic radiculopathy  THERAPY DIAG:  Pain in right arm  Other disturbances of skin sensation  Rationale for Evaluation and Treatment Rehabilitation  ONSET DATE: about 3 weeks ago  SUBJECTIVE:                                                                                                                                                                                                         SUBJECTIVE STATEMENT: Pt reports doing about the same with pain in mid-back pain and 4th and 5th digit RT hand being numb  PERTINENT HISTORY:  HTN  PAIN:  Are you having pain? Yes: NPRS scale: 5-7/10 Pain location: right shoulder and arm Pain description:  ache Aggravating factors: none found Relieving factors: supine or reclined  PRECAUTIONS: None  WEIGHT BEARING RESTRICTIONS No  OCCUPATION: about 50% standing and 50% at desk  NEXT MD FOLLOW UP: TBD   OBJECTIVE:   PATIENT SURVEYS:  FOTO to be completed at follow up  TODAY'S TREATMENT:  Modalities  Date: 03/05/2022  Traction: cervical, 13# max/5# min, 15 mins mins Unattended Estim: Cervical and Shoulder,  IFC 80-150 hz, 15 mins, Pain Combo: R side levator scap/cerv. paras, 1.5 w/cm2, 100%, 10 mins, Pain   PATIENT EDUCATION:  Education details: nerve mobility,  Person educated: Patient Education method: Explanation Education comprehension: verbalized understanding   HOME EXERCISE PROGRAM: [id number]  ASSESSMENT:  CLINICAL IMPRESSION: Pt arrived today doing about the same with pain RT mid-back and RT hand 4th and 5th digit numbness. Pt doesn't feel that any of the exs were helping with RT hand finger numbness and some increased his RT shldr blade pain. He sdid fairly well with Rx today, but not a significant difference end of session. Cervical traction was performed at 13#s again. No LTG's were met due to minimal change in status. Pt to see Ortho doctor next week and requested to be placed on hold at this time.   PHYSICAL THERAPY DISCHARGE SUMMARY  Visits from Start of Care: 6  Current functional level related to goals / functional outcomes: Patient was unable to make significant progress with skilled physical therapy.    Remaining deficits: No significant change since his initial evaluation    Education / Equipment: HEP    Patient agrees to discharge. Patient goals were not met. Patient is being discharged due to lack of progress.   Jacqulynn Cadet, PT, DPT   OBJECTIVE IMPAIRMENTS decreased activity tolerance, impaired sensation, impaired tone, impaired UE functional use, postural dysfunction, and pain.   ACTIVITY LIMITATIONS  none reported  PARTICIPATION  LIMITATIONS: community activity  PERSONAL FACTORS 1 comorbidity: HTN  are also affecting patient's functional outcome.   REHAB POTENTIAL: Good  CLINICAL DECISION MAKING: Evolving/moderate complexity  EVALUATION COMPLEXITY: Moderate   GOALS: Goals reviewed with patient? Yes  LONG TERM GOALS: Target date: 03/02/2022  Patient will be independent with his HEP.  Baseline:  Goal status: NM. Unable to perform  2.  Patient will be able to complete his daily activities without his familiar pain exceeding 2/10. Baseline: 4-5/10 Goal status: NM due to pain  3.  Patient will be able to complete his critical job demands without reproducing his familiar right hand numbness.  Baseline:  Goal status: NM  PLAN: PT FREQUENCY: 2x/week  PT DURATION: 4 weeks  PLANNED INTERVENTIONS: Therapeutic exercises, Therapeutic activity, Neuromuscular re-education, Patient/Family education, Self Care, Joint mobilization, Joint manipulation, Dry Needling, Electrical stimulation, Spinal manipulation, Spinal mobilization, Cryotherapy, Moist heat, Taping, Traction, Ultrasound, Manual therapy, and Re-evaluation  PLAN FOR NEXT SESSION: Assess symptoms and pain to determine modalities.   Amal Saiki,CHRIS, PTA 03/05/2022, 12:34 PM

## 2022-03-25 ENCOUNTER — Ambulatory Visit (INDEPENDENT_AMBULATORY_CARE_PROVIDER_SITE_OTHER): Payer: Managed Care, Other (non HMO) | Admitting: Family Medicine

## 2022-03-25 ENCOUNTER — Encounter: Payer: Self-pay | Admitting: Family Medicine

## 2022-03-25 VITALS — BP 146/93 | HR 56 | Wt 217.0 lb

## 2022-03-25 DIAGNOSIS — I10 Essential (primary) hypertension: Secondary | ICD-10-CM | POA: Diagnosis not present

## 2022-03-25 MED ORDER — AMLODIPINE-OLMESARTAN 10-40 MG PO TABS: 1.0000 | ORAL_TABLET | Freq: Every day | ORAL | 3 refills | Status: DC

## 2022-03-25 NOTE — Progress Notes (Signed)
BP (!) 146/93   Pulse (!) 56   Wt 217 lb (98.4 kg)   SpO2 99%   BMI 33.99 kg/m    Subjective:   Patient ID: Frederick Barr, male    DOB: 02-25-1966, 56 y.o.   MRN: [id number]  HPI: Frederick Barr is a 56 y.o. male presenting on 03/25/2022 for Medical Management of Chronic Issues and Hypertension   HPI Hypertension Patient is currently on propranolol amlodipine-benazepril and hydrochlorothiazide, and their blood pressure today is 146/93, he stopped the hydrochlorothiazide because of side effects.. Patient denies any lightheadedness or dizziness. Patient denies headaches, blurred vision, chest pains, shortness of breath, or weakness.  Patient has side effects from hydrochlorothiazide, since starting starting and erectile dysfunction function and all so he stopped it.  As soon as he stopped it he says the erectile dysfunction went away.  Patient has been having upper back pain shooting down his arm and numbness in his fourth and fifth digits on his right hand and had an MRI yesterday and is seeing a back doctor for this.  Relevant past medical, surgical, family and social history reviewed and updated as indicated. Interim medical history since our last visit reviewed. Allergies and medications reviewed and updated.  Review of Systems  Constitutional:  Negative for chills and fever.  Eyes:  Negative for visual disturbance.  Respiratory:  Negative for shortness of breath and wheezing.   Cardiovascular:  Negative for chest pain and leg swelling.  Musculoskeletal:  Positive for back pain. Negative for gait problem.  Skin:  Negative for rash.  Neurological:  Positive for numbness. Negative for dizziness, weakness, light-headedness and headaches.  All other systems reviewed and are negative.   Per HPI unless specifically indicated above   Allergies as of 03/25/2022   No Known Allergies      Medication List        Accurate as of March 25, 2022  4:39 PM. If you have any  questions, ask your nurse or doctor.          STOP taking these medications    amLODipine-benazepril 5-40 MG capsule Commonly known as: LOTREL Stopped by: Fransisca Kaufmann Mariaguadalupe Fialkowski, MD   hydrochlorothiazide 12.5 MG capsule Commonly known as: MICROZIDE Stopped by: Fransisca Kaufmann Annaleise Burger, MD       TAKE these medications    amLODipine-olmesartan 10-40 MG tablet Commonly known as: AZOR Take 1 tablet by mouth daily. Started by: Worthy Rancher, MD   cyclobenzaprine 10 MG tablet Commonly known as: FLEXERIL Take 1 tablet (10 mg total) by mouth 3 (three) times daily as needed for muscle spasms.   famotidine 40 MG tablet Commonly known as: PEPCID TAKE 1 TABLET BY MOUTH TWICE A DAY   icosapent Ethyl 1 g capsule Commonly known as: Vascepa Take 2 capsules (2 g total) by mouth 2 (two) times daily.   lansoprazole 30 MG capsule Commonly known as: PREVACID Take 1 capsule (30 mg total) by mouth daily at 12 noon.   meloxicam 15 MG tablet Commonly known as: MOBIC Take 1 tablet (15 mg total) by mouth daily.   predniSONE 10 MG tablet Commonly known as: DELTASONE Days 1-4 take 4 tablets (40 mg) daily  Days 5-8 take 3 tablets (30 mg) daily, Days 9-11 take 2 tablets (20 mg) daily, Days 12-14 take 1 tablet (10 mg) daily.   propranolol 20 MG tablet Commonly known as: INDERAL Take 1 tablet (20 mg total) by mouth 2 (two) times daily.   rosuvastatin 10 MG tablet  Commonly known as: CRESTOR Take 1 tablet (10 mg total) by mouth daily.   Vitamin D (Ergocalciferol) 1.25 MG (50000 UNIT) Caps capsule Commonly known as: DRISDOL TAKE 1 CAPSULE (50,000 UNITS TOTAL) BY MOUTH EVERY 7 (SEVEN) DAYS.         Objective:   BP (!) 146/93   Pulse (!) 56   Wt 217 lb (98.4 kg)   SpO2 99%   BMI 33.99 kg/m   Wt Readings from Last 3 Encounters:  03/25/22 217 lb (98.4 kg)  02/18/22 218 lb (98.9 kg)  02/10/22 214 lb 4 oz (97.2 kg)    Physical Exam Vitals and nursing note reviewed.  Constitutional:       General: He is not in acute distress.    Appearance: He is well-developed. He is not diaphoretic.  Eyes:     General: No scleral icterus.    Conjunctiva/sclera: Conjunctivae normal.  Neck:     Thyroid: No thyromegaly.  Cardiovascular:     Rate and Rhythm: Normal rate and regular rhythm.     Heart sounds: Normal heart sounds. No murmur heard. Pulmonary:     Effort: Pulmonary effort is normal. No respiratory distress.     Breath sounds: Normal breath sounds. No wheezing.  Musculoskeletal:        General: No swelling. Normal range of motion.     Cervical back: Neck supple.  Lymphadenopathy:     Cervical: No cervical adenopathy.  Skin:    General: Skin is warm and dry.     Findings: No rash.  Neurological:     Mental Status: He is alert and oriented to person, place, and time.     Coordination: Coordination normal.  Psychiatric:        Behavior: Behavior normal.       Assessment & Plan:   Problem List Items Addressed This Visit       Cardiovascular and Mediastinum   Hypertension - Primary   Relevant Medications   amLODipine-olmesartan (AZOR) 10-40 MG tablet    We will increase the amlodipine to 10-40 from 5-40. Follow up plan: Return if symptoms worsen or fail to improve, for 2 to 49-monthblood pressure recheck.  Counseling provided for all of the vaccine components No orders of the defined types were placed in this encounter.   JCaryl Pina MD [facility] 03/25/2022, 4:39 PM

## 2022-05-28 ENCOUNTER — Encounter: Payer: Self-pay | Admitting: Family Medicine

## 2022-05-28 ENCOUNTER — Ambulatory Visit: Payer: Managed Care, Other (non HMO) | Admitting: Family Medicine

## 2022-06-10 ENCOUNTER — Other Ambulatory Visit: Payer: Self-pay | Admitting: Family Medicine

## 2022-06-10 DIAGNOSIS — K219 Gastro-esophageal reflux disease without esophagitis: Secondary | ICD-10-CM

## 2022-06-10 DIAGNOSIS — M546 Pain in thoracic spine: Secondary | ICD-10-CM

## 2022-06-10 DIAGNOSIS — M5412 Radiculopathy, cervical region: Secondary | ICD-10-CM

## 2022-07-08 ENCOUNTER — Other Ambulatory Visit: Payer: Self-pay | Admitting: Family Medicine

## 2022-07-08 DIAGNOSIS — K219 Gastro-esophageal reflux disease without esophagitis: Secondary | ICD-10-CM

## 2022-07-15 ENCOUNTER — Other Ambulatory Visit: Payer: Self-pay | Admitting: Family Medicine

## 2022-07-15 DIAGNOSIS — I10 Essential (primary) hypertension: Secondary | ICD-10-CM

## 2022-07-30 ENCOUNTER — Other Ambulatory Visit: Payer: Self-pay | Admitting: Family Medicine

## 2022-07-30 DIAGNOSIS — K219 Gastro-esophageal reflux disease without esophagitis: Secondary | ICD-10-CM

## 2022-07-30 NOTE — Telephone Encounter (Signed)
Dettinger NTBS 30 days given 07/08/22

## 2022-08-02 ENCOUNTER — Ambulatory Visit (INDEPENDENT_AMBULATORY_CARE_PROVIDER_SITE_OTHER): Payer: Managed Care, Other (non HMO) | Admitting: Family Medicine

## 2022-08-02 ENCOUNTER — Encounter: Payer: Self-pay | Admitting: Family Medicine

## 2022-08-02 VITALS — BP 133/79 | HR 56 | Temp 98.0°F | Ht 67.0 in | Wt 223.0 lb

## 2022-08-02 DIAGNOSIS — Z0000 Encounter for general adult medical examination without abnormal findings: Secondary | ICD-10-CM

## 2022-08-02 DIAGNOSIS — N400 Enlarged prostate without lower urinary tract symptoms: Secondary | ICD-10-CM

## 2022-08-02 DIAGNOSIS — G4489 Other headache syndrome: Secondary | ICD-10-CM

## 2022-08-02 DIAGNOSIS — I10 Essential (primary) hypertension: Secondary | ICD-10-CM

## 2022-08-02 DIAGNOSIS — Z0001 Encounter for general adult medical examination with abnormal findings: Secondary | ICD-10-CM | POA: Diagnosis not present

## 2022-08-02 DIAGNOSIS — K219 Gastro-esophageal reflux disease without esophagitis: Secondary | ICD-10-CM

## 2022-08-02 DIAGNOSIS — E782 Mixed hyperlipidemia: Secondary | ICD-10-CM

## 2022-08-02 MED ORDER — ICOSAPENT ETHYL 1 G PO CAPS: 2.0000 g | ORAL_CAPSULE | Freq: Two times a day (BID) | ORAL | 3 refills | Status: DC

## 2022-08-02 MED ORDER — ROSUVASTATIN CALCIUM 10 MG PO TABS: 10.0000 mg | ORAL_TABLET | Freq: Every day | ORAL | 3 refills | Status: DC

## 2022-08-02 MED ORDER — FAMOTIDINE 40 MG PO TABS: 40.0000 mg | ORAL_TABLET | Freq: Two times a day (BID) | ORAL | 3 refills | Status: DC

## 2022-08-02 MED ORDER — LANSOPRAZOLE 30 MG PO CPDR: 30.0000 mg | DELAYED_RELEASE_CAPSULE | Freq: Every day | ORAL | 3 refills | Status: DC

## 2022-08-02 MED ORDER — PROPRANOLOL HCL 20 MG PO TABS: 20.0000 mg | ORAL_TABLET | Freq: Two times a day (BID) | ORAL | 3 refills | Status: DC

## 2022-08-02 NOTE — Telephone Encounter (Signed)
Appt scheduled for 08/02/22

## 2022-08-02 NOTE — Progress Notes (Signed)
BP 133/79   Pulse (!) 56   Temp 98 F (36.7 C)   Ht '5\' 7"'$  (1.702 m)   Wt 223 lb (101.2 kg)   SpO2 98%   BMI 34.93 kg/m    Subjective:   Patient ID: Frederick Barr, male    DOB: [birth year], 57 y.o.   MRN: [id number]  HPI: Frederick Barr is a 57 y.o. male presenting on 08/02/2022 for Medical Management of Chronic Issues and Hypertension   HPI Physical exam Patient denies any chest pain, shortness of breath, headaches or vision issues, abdominal complaints, diarrhea, nausea, vomiting, or joint issues.   Hypertension Patient is currently on amlodipine-olmesartan, and their blood pressure today is 133/79. Patient denies any lightheadedness or dizziness. Patient denies headaches, blurred vision, chest pains, shortness of breath, or weakness. Denies any side effects from medication and is content with current medication.   Hyperlipidemia Patient is coming in for recheck of his hyperlipidemia. The patient is currently taking Vascepa and Crestor. They deny any issues with myalgias or history of liver damage from it. They deny any focal numbness or weakness or chest pain.   BPH Patient is coming in for recheck on BPH Symptoms: None Medication: None Last PSA: May 2023  Relevant past medical, surgical, family and social history reviewed and updated as indicated. Interim medical history since our last visit reviewed. Allergies and medications reviewed and updated.  Review of Systems  Constitutional:  Negative for chills and fever.  HENT:  Negative for ear pain and tinnitus.   Eyes:  Negative for pain and discharge.  Respiratory:  Negative for cough, shortness of breath and wheezing.   Cardiovascular:  Negative for chest pain, palpitations and leg swelling.  Gastrointestinal:  Negative for abdominal pain, blood in stool, constipation and diarrhea.  Genitourinary:  Negative for dysuria and hematuria.  Musculoskeletal:  Negative for back pain, gait problem and myalgias.  Skin:  Negative  for rash.  Neurological:  Negative for dizziness, weakness and headaches.  Psychiatric/Behavioral:  Negative for suicidal ideas.   All other systems reviewed and are negative.   Per HPI unless specifically indicated above   Allergies as of 08/02/2022   No Known Allergies      Medication List        Accurate as of August 02, 2022  3:36 PM. If you have any questions, ask your nurse or doctor.          STOP taking these medications    cyclobenzaprine 10 MG tablet Commonly known as: FLEXERIL Stopped by: Worthy Rancher, MD   predniSONE 10 MG tablet Commonly known as: DELTASONE Stopped by: Fransisca Kaufmann Melenie Minniear, MD       TAKE these medications    amLODipine-olmesartan 10-40 MG tablet Commonly known as: AZOR Take 1 tablet by mouth daily.   famotidine 40 MG tablet Commonly known as: PEPCID Take 1 tablet (40 mg total) by mouth 2 (two) times daily.   icosapent Ethyl 1 g capsule Commonly known as: Vascepa Take 2 capsules (2 g total) by mouth 2 (two) times daily.   lansoprazole 30 MG capsule Commonly known as: PREVACID Take 1 capsule (30 mg total) by mouth daily at 12 noon. What changed: additional instructions Changed by: Fransisca Kaufmann Sophiagrace Benbrook, MD   meloxicam 15 MG tablet Commonly known as: MOBIC Take 1 tablet (15 mg total) by mouth daily.   propranolol 20 MG tablet Commonly known as: INDERAL Take 1 tablet (20 mg total) by mouth 2 (two) times daily.  rosuvastatin 10 MG tablet Commonly known as: CRESTOR Take 1 tablet (10 mg total) by mouth daily.   Vitamin D (Ergocalciferol) 1.25 MG (50000 UNIT) Caps capsule Commonly known as: DRISDOL TAKE 1 CAPSULE (50,000 UNITS TOTAL) BY MOUTH EVERY 7 (SEVEN) DAYS.         Objective:   BP 133/79   Pulse (!) 56   Temp 98 F (36.7 C)   Ht '5\' 7"'$  (1.702 m)   Wt 223 lb (101.2 kg)   SpO2 98%   BMI 34.93 kg/m   Wt Readings from Last 3 Encounters:  08/02/22 223 lb (101.2 kg)  03/25/22 217 lb (98.4 kg)  02/18/22  218 lb (98.9 kg)    Physical Exam Vitals reviewed.  Constitutional:      General: He is not in acute distress.    Appearance: He is well-developed. He is not diaphoretic.  HENT:     Right Ear: External ear normal.     Left Ear: External ear normal.     Nose: Nose normal.     Mouth/Throat:     Pharynx: No oropharyngeal exudate.  Eyes:     General: No scleral icterus.    Conjunctiva/sclera: Conjunctivae normal.  Neck:     Thyroid: No thyromegaly.  Cardiovascular:     Rate and Rhythm: Normal rate and regular rhythm.     Heart sounds: Normal heart sounds. No murmur heard. Pulmonary:     Effort: Pulmonary effort is normal. No respiratory distress.     Breath sounds: Normal breath sounds. No wheezing.  Abdominal:     General: Bowel sounds are normal. There is no distension.     Palpations: Abdomen is soft.     Tenderness: There is no abdominal tenderness. There is no guarding or rebound.  Musculoskeletal:        General: No swelling. Normal range of motion.     Cervical back: Neck supple.  Lymphadenopathy:     Cervical: No cervical adenopathy.  Skin:    General: Skin is warm and dry.     Findings: No rash.  Neurological:     Mental Status: He is alert and oriented to person, place, and time.     Coordination: Coordination normal.  Psychiatric:        Behavior: Behavior normal.       Assessment & Plan:   Problem List Items Addressed This Visit       Cardiovascular and Mediastinum   Hypertension   Relevant Medications   rosuvastatin (CRESTOR) 10 MG tablet   propranolol (INDERAL) 20 MG tablet   icosapent Ethyl (VASCEPA) 1 g capsule   Other Relevant Orders   CBC with Differential/Platelet   CMP14+EGFR     Digestive   GERD (gastroesophageal reflux disease)   Relevant Medications   lansoprazole (PREVACID) 30 MG capsule   famotidine (PEPCID) 40 MG tablet   Other Relevant Orders   CBC with Differential/Platelet   CMP14+EGFR     Genitourinary   Benign prostatic  hyperplasia without lower urinary tract symptoms   Relevant Orders   PSA, total and free     Other   Hyperlipidemia   Relevant Medications   rosuvastatin (CRESTOR) 10 MG tablet   propranolol (INDERAL) 20 MG tablet   icosapent Ethyl (VASCEPA) 1 g capsule   Other Relevant Orders   Lipid panel   Other Visit Diagnoses     Physical exam    -  Primary   Relevant Orders   CBC with Differential/Platelet  CMP14+EGFR   Lipid panel   Headache syndrome       Relevant Medications   propranolol (INDERAL) 20 MG tablet       Recheck in 1 year, will do blood work today Follow up plan: Return in about 1 year (around 08/03/2023), or if symptoms worsen or fail to improve, for Physical exam and hypertension recheck.  Counseling provided for all of the vaccine components Orders Placed This Encounter  Procedures   CBC with Differential/Platelet   CMP14+EGFR   Lipid panel   PSA, total and free    Caryl Pina, MD [facility] 08/02/2022, 3:36 PM

## 2022-08-04 ENCOUNTER — Other Ambulatory Visit: Payer: Self-pay | Admitting: Family Medicine

## 2022-08-04 DIAGNOSIS — I10 Essential (primary) hypertension: Secondary | ICD-10-CM

## 2022-08-06 LAB — PSA, TOTAL AND FREE
PSA, Free Pct: 33.3 %
PSA, Free: 0.2 ng/mL
Prostate Specific Ag, Serum: 0.6 ng/mL (ref 0.0–4.0)

## 2022-08-06 LAB — CMP14+EGFR
ALT: 18 IU/L (ref 0–44)
AST: 18 IU/L (ref 0–40)
Albumin/Globulin Ratio: 2.4 — ABNORMAL HIGH (ref 1.2–2.2)
Albumin: 4.6 g/dL (ref 3.8–4.9)
Alkaline Phosphatase: 71 IU/L (ref 44–121)
BUN/Creatinine Ratio: 18 (ref 9–20)
BUN: 15 mg/dL (ref 6–24)
Bilirubin Total: 0.2 mg/dL (ref 0.0–1.2)
CO2: 24 mmol/L (ref 20–29)
Calcium: 9.3 mg/dL (ref 8.7–10.2)
Chloride: 102 mmol/L (ref 96–106)
Creatinine, Ser: 0.84 mg/dL (ref 0.76–1.27)
Globulin, Total: 1.9 g/dL (ref 1.5–4.5)
Glucose: 99 mg/dL (ref 70–99)
Potassium: 4.4 mmol/L (ref 3.5–5.2)
Sodium: 140 mmol/L (ref 134–144)
Total Protein: 6.5 g/dL (ref 6.0–8.5)
eGFR: 101 mL/min/{1.73_m2} (ref >59)

## 2022-08-06 LAB — CBC WITH DIFFERENTIAL/PLATELET
Basophils Absolute: 0.1 10*3/uL (ref 0.0–0.2)
Basos: 1 %
EOS (ABSOLUTE): 0.2 10*3/uL (ref 0.0–0.4)
Eos: 3 %
Hematocrit: 43.4 % (ref 37.5–51.0)
Hemoglobin: 14.2 g/dL (ref 13.0–17.7)
Immature Grans (Abs): 0 10*3/uL (ref 0.0–0.1)
Immature Granulocytes: 0 %
Lymphocytes Absolute: 3.8 10*3/uL — ABNORMAL HIGH (ref 0.7–3.1)
Lymphs: 44 %
MCH: 27.3 pg (ref 26.6–33.0)
MCHC: 32.7 g/dL (ref 31.5–35.7)
MCV: 84 fL (ref 79–97)
Monocytes Absolute: 0.6 10*3/uL (ref 0.1–0.9)
Monocytes: 7 %
Neutrophils Absolute: 3.9 10*3/uL (ref 1.4–7.0)
Neutrophils: 45 %
Platelets: 266 10*3/uL (ref 150–450)
RBC: 5.2 x10E6/uL (ref 4.14–5.80)
RDW: 13.1 % (ref 11.6–15.4)
WBC: 8.6 10*3/uL (ref 3.4–10.8)

## 2022-08-06 LAB — LIPID PANEL
Chol/HDL Ratio: 4 ratio (ref 0.0–5.0)
Cholesterol, Total: 131 mg/dL (ref 100–199)
HDL: 33 mg/dL — ABNORMAL LOW (ref >39)
LDL Chol Calc (NIH): 65 mg/dL (ref 0–99)
Triglycerides: 196 mg/dL — ABNORMAL HIGH (ref 0–149)
VLDL Cholesterol Cal: 33 mg/dL (ref 5–40)

## 2022-08-29 ENCOUNTER — Other Ambulatory Visit: Payer: Self-pay | Admitting: Family Medicine

## 2022-08-29 DIAGNOSIS — M5414 Radiculopathy, thoracic region: Secondary | ICD-10-CM

## 2022-10-11 ENCOUNTER — Encounter: Payer: Self-pay | Admitting: Family

## 2022-10-11 ENCOUNTER — Ambulatory Visit (INDEPENDENT_AMBULATORY_CARE_PROVIDER_SITE_OTHER): Payer: Managed Care, Other (non HMO) | Admitting: Family

## 2022-10-11 VITALS — BP 127/83 | HR 77 | Temp 97.5°F | Ht 67.0 in | Wt 218.2 lb

## 2022-10-11 DIAGNOSIS — M5412 Radiculopathy, cervical region: Secondary | ICD-10-CM

## 2022-10-11 DIAGNOSIS — J441 Chronic obstructive pulmonary disease with (acute) exacerbation: Secondary | ICD-10-CM | POA: Diagnosis not present

## 2022-10-11 MED ORDER — BENZONATATE 200 MG PO CAPS
200.0000 mg | ORAL_CAPSULE | Freq: Three times a day (TID) | ORAL | 1 refills | Status: DC | PRN
Start: 2022-10-11 — End: 2022-10-29

## 2022-10-11 MED ORDER — PREDNISONE 10 MG (21) PO TBPK
ORAL_TABLET | ORAL | 0 refills | Status: DC
Start: 2022-10-11 — End: 2022-10-29

## 2022-10-11 MED ORDER — DICLOFENAC SODIUM 75 MG PO TBEC
75.0000 mg | DELAYED_RELEASE_TABLET | Freq: Two times a day (BID) | ORAL | 1 refills | Status: DC
Start: 2022-10-11 — End: 2022-12-07

## 2022-10-11 MED ORDER — ALBUTEROL SULFATE HFA 108 (90 BASE) MCG/ACT IN AERS
2.0000 | INHALATION_SPRAY | Freq: Four times a day (QID) | RESPIRATORY_TRACT | 0 refills | Status: DC | PRN
Start: 2022-10-11 — End: 2022-11-02

## 2022-10-11 MED ORDER — BACLOFEN 10 MG PO TABS
10.0000 mg | ORAL_TABLET | Freq: Three times a day (TID) | ORAL | 0 refills | Status: AC
Start: 2022-10-11 — End: ?

## 2022-10-11 NOTE — Progress Notes (Signed)
Subjective:    Patient ID: Frederick Barr, male    DOB: [birth year], 57 y.o.   MRN: [id number]  Chief Complaint  Patient presents with   URI    Started thursday   Back Pain    Back pain has pinched nerve mri showed last year cough is making it worse     URI  This is a new problem. The current episode started in the past 7 days. The problem has been gradually worsening. There has been no fever. Associated symptoms include congestion, coughing, headaches, rhinorrhea, sinus pain (improved) and wheezing. Pertinent negatives include no ear pain, joint pain, joint swelling, sore throat, swollen glands or vomiting. He has tried acetaminophen and decongestant for the symptoms. The treatment provided mild relief.  Back Pain This is a new problem. The current episode started 1 to 4 weeks ago. The problem occurs intermittently. The problem has been waxing and waning since onset. The pain is present in the thoracic spine. Radiates to: right arm. The pain is at a severity of 3/10 (has been up to a 10). The symptoms are aggravated by coughing. Associated symptoms include headaches.      Review of Systems  HENT:  Positive for congestion, rhinorrhea and sinus pain (improved). Negative for ear pain and sore throat.   Respiratory:  Positive for cough and wheezing.   Gastrointestinal:  Negative for vomiting.  Musculoskeletal:  Positive for back pain. Negative for joint pain.  Neurological:  Positive for headaches.  All other systems reviewed and are negative.      Objective:   Physical Exam Vitals reviewed.  Constitutional:      General: He is not in acute distress.    Appearance: He is well-developed.  HENT:     Head: Normocephalic.     Right Ear: Tympanic membrane normal.     Left Ear: Tympanic membrane normal.  Eyes:     General:        Right eye: No discharge.        Left eye: No discharge.     Pupils: Pupils are equal, round, and reactive to light.  Neck:     Thyroid: No thyromegaly.   Cardiovascular:     Rate and Rhythm: Normal rate and regular rhythm.     Heart sounds: Normal heart sounds. No murmur heard. Pulmonary:     Effort: Pulmonary effort is normal. No respiratory distress.     Breath sounds: Wheezing present.  Abdominal:     General: Bowel sounds are normal. There is no distension.     Palpations: Abdomen is soft.     Tenderness: There is no abdominal tenderness.  Musculoskeletal:        General: No tenderness. Normal range of motion.     Cervical back: Normal range of motion and neck supple.  Skin:    General: Skin is warm and dry.     Findings: No erythema or rash.  Neurological:     Mental Status: He is alert and oriented to person, place, and time.     Cranial Nerves: No cranial nerve deficit.     Deep Tendon Reflexes: Reflexes are normal and symmetric.  Psychiatric:        Behavior: Behavior normal.        Thought Content: Thought content normal.        Judgment: Judgment normal.     BP 127/83   Pulse 77   Temp (!) 97.5 F (36.4 C) (Temporal)   Ht 5'  7" (1.702 m)   Wt 218 lb 3.2 oz (99 kg)   SpO2 92%   BMI 34.17 kg/m        Assessment & Plan:  Saurabh Carmona comes in today with chief complaint of URI (Started thursday) and Back Pain (Back pain has pinched nerve mri showed last year cough is making it worse )   Diagnosis and orders addressed:  1. COPD exacerbation - Take meds as prescribed - Use a cool mist humidifier  -Use saline nose sprays frequently -Force fluids -For any cough or congestion  Use plain Mucinex- regular strength or max strength is fine -For fever or aces or pains- take tylenol or ibuprofen. -Throat lozenges if help -Follow up if symptoms worsen or do not improve  - albuterol (VENTOLIN HFA) 108 (90 Base) MCG/ACT inhaler; Inhale 2 puffs into the lungs every 6 (six) hours as needed for wheezing or shortness of breath.  Dispense: 8 g; Refill: 0 - predniSONE (STERAPRED UNI-PAK 21 TAB) 10 MG (21) TBPK tablet; Use  as directed  Dispense: 21 tablet; Refill: 0 - benzonatate (TESSALON) 200 MG capsule; Take 1 capsule (200 mg total) by mouth 3 (three) times daily as needed.  Dispense: 30 capsule; Refill: 1  2. Cervical radiculopathy Rest Ice ROM exercises No other NSIAD's while taking diclofenac  Sedation precautions with baclofen  - predniSONE (STERAPRED UNI-PAK 21 TAB) 10 MG (21) TBPK tablet; Use as directed  Dispense: 21 tablet; Refill: 0 - diclofenac (VOLTAREN) 75 MG EC tablet; Take 1 tablet (75 mg total) by mouth 2 (two) times daily.  Dispense: 60 tablet; Refill: 1 - baclofen (LIORESAL) 10 MG tablet; Take 1 tablet (10 mg total) by mouth 3 (three) times daily.  Dispense: 30 each; Refill: 0     Evelina Dun, FNP

## 2022-10-11 NOTE — Patient Instructions (Signed)
Cervical Radiculopathy  Cervical radiculopathy happens when a nerve in the neck (a cervical nerve) is pinched or bruised. This condition can happen because of an injury to the cervical spine (vertebrae) in the neck, or as part of the normal aging process. Pressure on the cervical nerves can cause pain or numbness that travels from the neck all the way down to the arm and fingers. This condition usually gets better with rest. Treatment may be needed if the condition does not improve. What are the causes? This condition may be caused by: A neck injury. A bulging (herniated) disk. Muscle spasms. Muscle tightness in the neck due to overuse. Arthritis. Breakdown or degeneration in the bones and joints of the spine (spondylosis) due to aging. Bone spurs that may develop near the cervical nerves. What are the signs or symptoms? Symptoms of this condition include: Pain. The pain may travel from the neck to the arm and hand. The pain can be severe or irritating. It may get worse when you move your neck. Numbness or tingling in your arm or hand. Weakness in the affected arm and hand, in severe cases. How is this diagnosed? This condition may be diagnosed based on your symptoms, your medical history, and a physical exam. You may also have tests, including: X-rays. CT scan. MRI. Electromyogram (EMG). Nerve conduction tests. How is this treated? In many cases, treatment is not needed for this condition. With rest, the condition usually gets better over time. If treatment is needed, options may include: Wearing a soft neck collar (cervical collar) for short periods of time. Doing physical therapy to strengthen your neck muscles. Taking medicines. These may include NSAIDs, such as ibuprofen, or oral corticosteroids. Having spinal injections, in severe cases. Having surgery. This may be needed if other treatments do not help. Different types of surgery may be done depending on the cause of this  condition. Follow these instructions at home: If you have a cervical collar: Wear it as told by your health care provider. Remove it only as told by your health care provider. Ask your health care provider if you can remove the cervical collar for cleaning and bathing. If you are allowed to remove the collar for cleaning or bathing: Follow instructions from your health care provider about how to remove the collar safely. Clean the collar by wiping it with mild soap and water and drying it completely. Take out any removable pads in the collar every 1-2 days, and wash them by hand with soap and water. Let them air-dry completely before you put them back in the collar. Check your skin under the collar for irritation or sores. If you see any, tell your health care provider. Managing pain     Take over-the-counter and prescription medicines only as told by your health care provider. If directed, put ice on the affected area. To do this: If you have a soft neck collar, remove it as told by your health care provider. Put ice in a plastic bag. Place a towel between your skin and the bag. Leave the ice on for 20 minutes, 2-3 times a day. Remove the ice if your skin turns bright red. This is very important. If you cannot feel pain, heat, or cold, you have a greater risk of damage to the area. If applying ice does not help, you can try using heat. Use the heat source that your health care provider recommends, such as a moist heat pack or a heating pad. Place a towel between   your skin and the heat source. Leave the heat on for 20-30 minutes. Remove the heat if your skin turns bright red. This is especially important if you are unable to feel pain, heat, or cold. You have a greater risk of getting burned. Try a gentle neck and shoulder massage to help relieve symptoms. Activity Rest as needed. Return to your normal activities as told by your health care provider. Ask your health care provider what  activities are safe for you. Do stretching and strengthening exercises as told by your health care provider or your physical therapist. You may have to avoid lifting. Ask your health care provider how much you can safely lift. General instructions Use a flat pillow when you sleep. Do not drive while wearing a cervical collar. If you do not have a cervical collar, ask your health care provider if it is safe to drive while your neck heals. Ask your health care provider if the medicine prescribed to you requires you to avoid driving or using machinery. Do not use any products that contain nicotine or tobacco. These products include cigarettes, chewing tobacco, and vaping devices, such as e-cigarettes. If you need help quitting, ask your health care provider. Keep all follow-up visits. This is important. Contact a health care provider if: Your condition does not improve with treatment. Get help right away if: Your pain gets much worse and is not controlled with medicines. You have weakness or numbness in your hand, arm, face, or leg. You have a high fever. You have a stiff, rigid neck. You lose control of your bowels or your bladder (have incontinence). You have trouble with walking, balance, or speaking. Summary Cervical radiculopathy happens when a nerve in the neck is pinched or bruised. A nerve can get pinched from a bulging disk, arthritis, muscle spasms, or an injury to the neck. Symptoms include pain, tingling, or numbness radiating from the neck to the arm or hand. Weakness can also occur in severe cases. Treatment may include rest, wearing a cervical collar, and physical therapy. Medicines may be prescribed to help with pain. In severe cases, injections or surgery may be needed. This information is not intended to replace advice given to you by your health care provider. Make sure you discuss any questions you have with your health care provider. Document Revised: 01/01/2021 Document  Reviewed: 01/01/2021 Elsevier Patient Education  2023 [employer]  

## 2022-10-14 ENCOUNTER — Other Ambulatory Visit: Payer: Self-pay | Admitting: Family Medicine

## 2022-10-14 ENCOUNTER — Telehealth: Payer: Self-pay | Admitting: Family Medicine

## 2022-10-14 DIAGNOSIS — J441 Chronic obstructive pulmonary disease with (acute) exacerbation: Secondary | ICD-10-CM

## 2022-10-14 MED ORDER — PROMETHAZINE-DM 6.25-15 MG/5ML PO SYRP
5.0000 mL | ORAL_SOLUTION | Freq: Four times a day (QID) | ORAL | 0 refills | Status: DC | PRN
Start: 2022-10-14 — End: 2022-10-29

## 2022-10-14 MED ORDER — AMOXICILLIN 500 MG PO CAPS
500.0000 mg | ORAL_CAPSULE | Freq: Two times a day (BID) | ORAL | 0 refills | Status: DC
Start: 2022-10-14 — End: 2022-10-29

## 2022-10-14 NOTE — Progress Notes (Unsigned)
I sent amoxicillin and cough syrup for him that he can try.  If he still not doing good from there then he may need to come back in and be seen.

## 2022-10-14 NOTE — Telephone Encounter (Signed)
  Incoming Patient Call  10/14/2022  What symptoms do you have? Chest congestion, really bad cough  How long have you been sick? About a week  Have you been seen for this problem? Yes on 10/11/2022, medication was prescribed but it is not working   If your provider decides to give you a prescription, which pharmacy would you like for it to be sent to? [facility]    Patient informed that this information will be sent to the clinical staff for review and that they should receive a follow up call.

## 2022-10-14 NOTE — Progress Notes (Signed)
Patient aware and verbalized understanding. °

## 2022-10-29 ENCOUNTER — Emergency Department (HOSPITAL_BASED_OUTPATIENT_CLINIC_OR_DEPARTMENT_OTHER): Payer: Managed Care, Other (non HMO)

## 2022-10-29 ENCOUNTER — Other Ambulatory Visit: Payer: Self-pay

## 2022-10-29 ENCOUNTER — Encounter (HOSPITAL_BASED_OUTPATIENT_CLINIC_OR_DEPARTMENT_OTHER): Payer: Self-pay

## 2022-10-29 ENCOUNTER — Ambulatory Visit: Payer: Managed Care, Other (non HMO) | Admitting: Family Medicine

## 2022-10-29 ENCOUNTER — Emergency Department (HOSPITAL_BASED_OUTPATIENT_CLINIC_OR_DEPARTMENT_OTHER)
Admission: EM | Admit: 2022-10-29 | Discharge: 2022-10-29 | Disposition: A | Discharge status: Home / Self Care | Payer: Managed Care, Other (non HMO) | Attending: Emergency Medicine | Admitting: Emergency Medicine

## 2022-10-29 DIAGNOSIS — M542 Cervicalgia: Secondary | ICD-10-CM | POA: Insufficient documentation

## 2022-10-29 MED ORDER — HYDROCODONE-ACETAMINOPHEN 5-325 MG PO TABS
2.0000 | ORAL_TABLET | Freq: Once | ORAL | Status: AC
  Administered 2022-10-29: 2 via ORAL
  Filled 2022-10-29: qty 2

## 2022-10-29 NOTE — ED Triage Notes (Signed)
Neck pain / upper back pain with right arm numbness x 1.5 months. Initially given some steroids with no improvement.   Went to [facility] today and had an XR done resulting in a fractured spinal process of C5.

## 2022-10-29 NOTE — Discharge Instructions (Signed)
You have neck pain, possibly from a cervical strain and/or pinched nerve.  ° °SEEK IMMEDIATE MEDICAL ATTENTION IF: °You develop difficulties swallowing or breathing.  °You have new or worse numbness, weakness, tingling, or movement problems in your arms or legs.  °You develop increasing pain which is uncontrolled with medications.  °You have change in bowel or bladder function, or other concerns. ° ° ° °

## 2022-10-30 NOTE — ED Provider Notes (Signed)
[place] EMERGENCY DEPARTMENT AT [facility] Provider Note   CSN: [id number] Arrival date & time: 10/29/22  1959     History  Chief Complaint  Patient presents with   Neck Pain    Frederick Barr is a 57 y.o. male.  The history is provided by the patient and the spouse.   Patient sent to the ER because his chiropractor told him he had a fractured neck Patient reports he has had upper back and neck pain for months.  At times he will have numbness that radiates into the right arm.  No arm weakness.  No leg weakness.  No urine or fecal incontinence.  No previous neck or back surgery.  He started seeing a [facility].  On his most recent visit earlier this week he had a lot of pain so the provider did an x-ray of his C-spine.  He was later informed that he had a fractured spinal process at C5.      Home Medications Prior to Admission medications   Medication Sig Start Date End Date Taking? Authorizing Provider  albuterol (VENTOLIN HFA) 108 (90 Base) MCG/ACT inhaler Inhale 2 puffs into the lungs every 6 (six) hours as needed for wheezing or shortness of breath. 10/11/22   Jannifer Rodney A, FNP  amLODipine-olmesartan (AZOR) 10-40 MG tablet Take 1 tablet by mouth daily. 03/25/22   Dettinger, Elige Radon, MD  baclofen (LIORESAL) 10 MG tablet Take 1 tablet (10 mg total) by mouth 3 (three) times daily. 10/11/22   Junie Spencer, FNP  diclofenac (VOLTAREN) 75 MG EC tablet Take 1 tablet (75 mg total) by mouth 2 (two) times daily. 10/11/22   Junie Spencer, FNP  famotidine (PEPCID) 40 MG tablet Take 1 tablet (40 mg total) by mouth 2 (two) times daily. 08/02/22   Dettinger, Elige Radon, MD  icosapent Ethyl (VASCEPA) 1 g capsule Take 2 capsules (2 g total) by mouth 2 (two) times daily. Patient not taking: Reported on 10/11/2022 08/02/22   Dettinger, Elige Radon, MD  propranolol (INDERAL) 20 MG tablet Take 1 tablet (20 mg total) by mouth 2 (two) times daily. 08/02/22   Dettinger, Elige Radon, MD   rosuvastatin (CRESTOR) 10 MG tablet Take 1 tablet (10 mg total) by mouth daily. 08/02/22   Dettinger, Elige Radon, MD  Vitamin D, Ergocalciferol, (DRISDOL) 1.25 MG (50000 UNIT) CAPS capsule TAKE 1 CAPSULE (50,000 UNITS TOTAL) BY MOUTH EVERY 7 (SEVEN) DAYS. Patient not taking: Reported on 10/11/2022 02/22/20   Dettinger, Elige Radon, MD  Vitamins A & D (VITAMIN A & D) 10000-400 units TABS Take by mouth.    [provider]      Allergies    Patient has no known allergies.    Review of Systems   Review of Systems  Musculoskeletal:  Positive for neck pain.  Neurological:  Negative for weakness.    Physical Exam Updated Vital Signs BP (!) 149/99   Pulse 80   Temp 98.2 F (36.8 C)   Resp 16   Ht 1.702 m (5\' 7" )   Wt 97.5 kg   SpO2 97%   BMI 33.67 kg/m  Physical Exam CONSTITUTIONAL: Well developed/well nourished HEAD: Normocephalic/atraumatic EYES: EOMI/PERRL, no nystagmus, no ptosis ENMT: Mucous membranes moist NECK: supple no meningeal signs, no bruits Mild right-sided cervical paraspinal tenderness Right-sided thoracic paraspinal tenderness.  No bruising, no erythema CV: S1/S2 noted LUNGS: Lungs are clear to auscultation bilaterally, no apparent distress NEURO:Awake/alert, face symmetric, no arm or leg drift is noted  Equal 5/5 strength with shoulder abduction, elbow flex/extension, wrist flex/extension in upper extremities and equal hand grips bilaterally Equal 5/5 strength with hip flexion,knee flex/extension, foot dorsi/plantar flexion Cranial nerves 3/4/5/6/01/17/09/11/12 tested and intact Gait normal without ataxia Equal (2+) biceps/brachioradialis reflex in bilateral UE EXTREMITIES: pulses normal, full ROM, no deformities SKIN: warm, color normal PSYCH: no abnormalities of mood noted  ED Results / Procedures / Treatments   Labs (all labs ordered are listed, but only abnormal results are displayed) Labs Reviewed - No data to display  EKG None  Radiology CT  CERVICAL SPINE WO CONTRAST  Result Date: 10/29/2022 CLINICAL DATA:  Polytrauma EXAM: CT CERVICAL SPINE WITHOUT CONTRAST TECHNIQUE: Multidetector CT imaging of the cervical spine was performed without intravenous contrast. Multiplanar CT image reconstructions were also generated. RADIATION DOSE REDUCTION: This exam was performed according to the departmental dose-optimization program which includes automated exposure control, adjustment of the mA and/or kV according to patient size and/or use of iterative reconstruction technique. COMPARISON:  None Available. FINDINGS: Alignment: Straightening of the normal cervical lordosis. Skull base and vertebrae: No acute fracture. No primary bone lesion or focal pathologic process. Soft tissues and spinal canal: No prevertebral fluid or swelling. No visible canal hematoma. Disc levels:  No evidence of high-grade spinal canal stenosis. Upper chest: Negative. Other: None. IMPRESSION: No acute fracture or traumatic malalignment Electronically Signed   By: Lorenza Cambridge M.D.   On: 10/29/2022 21:25    Procedures Procedures    Medications Ordered in ED Medications  HYDROcodone-acetaminophen (NORCO/VICODIN) 5-325 MG per tablet 2 tablet (2 tablets Oral Given 10/29/22 2336)    ED Course/ Medical Decision Making/ A&P                             Medical Decision Making Risk Prescription drug management.   This patient presents to the ED for concern of neck pain, this involves an extensive number of treatment options, and is a complaint that carries with it a high risk of complications and morbidity.  The differential diagnosis includes but is not limited to muscle strain, cervical radiculopathy, cervical spine fracture, discitis, epidural abscess  Comorbidities that complicate the patient evaluation: Patient's presentation is complicated by their history of hypertension  Social Determinants of Health: Patient's  occupation   increases the complexity of managing  their presentation  Additional history obtained: Additional history obtained from spouse Records reviewed Care Everywhere/External Records   Imaging Studies ordered: I ordered imaging studies including CT scan C-spine   I independently visualized and interpreted imaging which showed no acute fracture I agree with the radiologist interpretation  Medicines ordered and prescription drug management: I ordered medication including Vicodin for pain  Complexity of problems addressed: Patient's presentation is most consistent with  acute complicated illness/injury requiring diagnostic workup  Disposition: After consideration of the diagnostic results and the patient's response to treatment,  I feel that the patent would benefit from discharge   .    Patient presents because he was told he had a fractured C-spine spinous process.  CT imaging here is negative.  He has no focal neurodeficits.  He has good strength in all of his extremities.  He can ambulate.  He is safe for discharge.  He already has PCP follow-up next week.  I advised follow-up with neurosurgery in the next several weeks.  He will likely benefit from MRI C-spine and thoracic spine        Final Clinical  Impression(s) / ED Diagnoses Final diagnoses:  Neck pain    Rx / DC Orders ED Discharge Orders     None         Zadie Rhine, MD 10/30/22 0009

## 2022-11-02 ENCOUNTER — Other Ambulatory Visit: Payer: Self-pay | Admitting: Family

## 2022-11-02 DIAGNOSIS — J441 Chronic obstructive pulmonary disease with (acute) exacerbation: Secondary | ICD-10-CM

## 2022-11-04 ENCOUNTER — Encounter: Payer: Self-pay | Admitting: Family Medicine

## 2022-11-04 ENCOUNTER — Ambulatory Visit: Payer: Managed Care, Other (non HMO) | Admitting: Family Medicine

## 2022-11-04 VITALS — BP 106/69 | HR 69 | Ht 67.0 in | Wt 218.0 lb

## 2022-11-04 DIAGNOSIS — G5621 Lesion of ulnar nerve, right upper limb: Secondary | ICD-10-CM

## 2022-11-04 DIAGNOSIS — M5412 Radiculopathy, cervical region: Secondary | ICD-10-CM

## 2022-11-04 NOTE — Progress Notes (Signed)
BP 106/69   Pulse 69   Ht  (1.702 m)   Wt 218 lb (98.9 kg)   SpO2 98%   BMI 34.14 kg/m    Subjective:   Patient ID: Frederick Barr, male    DOB: 1965/09/28, 57 y.o.   MRN: [id number]  HPI: Frederick Barr is a 57 y.o. male presenting on 11/04/2022 for Neck Pain (Not improving. Unsure if has a fracture)   HPI Neck pain and numbness Patient is coming in today with complaints of neck pain and feels like he has a pinched nerve.  He has pain in his upper back and lower neck and then has numbness going down the lateral aspect of his upper arm down into his ring and little finger on his hand.  He sometimes gets pain on the outside of his elbow that is a tingling and burning sensation as well.  He was initially seen 3 weeks ago in our office for this and was given prednisone and he said it did not really seem to improve with that.  He was then seen at a chiropractor and did not seem to get any improvement with.  They had an x-ray which showed some abnormalities but then the CT scan came back normal.  He has an appointment with neurosurgery later today.  Relevant past medical, surgical, family and social history reviewed and updated as indicated. Interim medical history since our last visit reviewed. Allergies and medications reviewed and updated.  Review of Systems  Constitutional:  Negative for chills and fever.  Respiratory:  Negative for shortness of breath and wheezing.   Cardiovascular:  Negative for chest pain and leg swelling.  Musculoskeletal:  Positive for arthralgias, myalgias and neck pain. Negative for back pain and gait problem.  Skin:  Negative for rash.  Neurological:  Positive for numbness. Negative for weakness.  All other systems reviewed and are negative.   Per HPI unless specifically indicated above   Allergies as of 11/04/2022   No Known Allergies      Medication List        Accurate as of November 04, 2022  9:24 AM. If you have any questions, ask your nurse or  doctor.          STOP taking these medications    albuterol 108 (90 Base) MCG/ACT inhaler Commonly known as: VENTOLIN HFA Stopped by: Nils Pyle, MD   Vitamin A & D 10000-400 units Tabs Stopped by: Elige Radon Dontee Jaso, MD       TAKE these medications    amLODipine-olmesartan 10-40 MG tablet Commonly known as: AZOR Take 1 tablet by mouth daily.   baclofen 10 MG tablet Commonly known as: LIORESAL Take 1 tablet (10 mg total) by mouth 3 (three) times daily.   diclofenac 75 MG EC tablet Commonly known as: VOLTAREN Take 1 tablet (75 mg total) by mouth 2 (two) times daily.   famotidine 40 MG tablet Commonly known as: PEPCID Take 1 tablet (40 mg total) by mouth 2 (two) times daily.   icosapent Ethyl 1 g capsule Commonly known as: Vascepa Take 2 capsules (2 g total) by mouth 2 (two) times daily.   propranolol 20 MG tablet Commonly known as: INDERAL Take 1 tablet (20 mg total) by mouth 2 (two) times daily.   rosuvastatin 10 MG tablet Commonly known as: CRESTOR Take 1 tablet (10 mg total) by mouth daily.   Vitamin D (Ergocalciferol) 1.25 MG (50000 UNIT) Caps capsule Commonly known as: DRISDOL TAKE  1 CAPSULE (50,000 UNITS TOTAL) BY MOUTH EVERY 7 (SEVEN) DAYS.         Objective:   BP 106/69   Pulse 69   Ht  (1.702 m)   Wt 218 lb (98.9 kg)   SpO2 98%   BMI 34.14 kg/m   Wt Readings from Last 3 Encounters:  11/04/22 218 lb (98.9 kg)  10/29/22 215 lb (97.5 kg)  10/11/22 218 lb 3.2 oz (99 kg)    Physical Exam Vitals and nursing note reviewed.  Constitutional:      General: He is not in acute distress.    Appearance: He is well-developed. He is not diaphoretic.  Eyes:     General: No scleral icterus.    Conjunctiva/sclera: Conjunctivae normal.  Neck:     Thyroid: No thyromegaly.     Comments: Putting his arm over his head does make it a little bit better. Cardiovascular:     Rate and Rhythm: Normal rate and regular rhythm.     Heart sounds:  Normal heart sounds. No murmur heard. Pulmonary:     Effort: Pulmonary effort is normal. No respiratory distress.     Breath sounds: Normal breath sounds. No wheezing.  Musculoskeletal:        General: Normal range of motion.     Cervical back: No rigidity or crepitus. No pain with movement. Normal range of motion.  Skin:    General: Skin is warm and dry.     Findings: No rash.  Neurological:     Mental Status: He is alert and oriented to person, place, and time.     Coordination: Coordination normal.  Psychiatric:        Behavior: Behavior normal.       Assessment & Plan:   Problem List Items Addressed This Visit   None Visit Diagnoses     Cervical radiculopathy    -  Primary   Ulnar neuropathy of right upper extremity           Patient has an appointment with Dr. Dutch Quint with neurosurgery later today, we will have him see them and not going to do any necessary treatment today and see what Dr. Dutch Quint says and then follow-up from there. Follow up plan: Return if symptoms worsen or fail to improve.  Counseling provided for all of the vaccine components No orders of the defined types were placed in this encounter.   Arville Care, MD [facility] 11/04/2022, 9:24 AM

## 2022-11-26 ENCOUNTER — Other Ambulatory Visit: Payer: Self-pay | Admitting: Family Medicine

## 2022-11-26 DIAGNOSIS — M5414 Radiculopathy, thoracic region: Secondary | ICD-10-CM

## 2022-12-05 ENCOUNTER — Other Ambulatory Visit: Payer: Self-pay | Admitting: Family

## 2022-12-05 DIAGNOSIS — M5412 Radiculopathy, cervical region: Secondary | ICD-10-CM

## 2023-02-05 ENCOUNTER — Other Ambulatory Visit: Payer: Self-pay | Admitting: Family Medicine

## 2023-02-05 DIAGNOSIS — M5412 Radiculopathy, cervical region: Secondary | ICD-10-CM

## 2023-03-06 ENCOUNTER — Other Ambulatory Visit: Payer: Self-pay | Admitting: Family Medicine

## 2023-03-06 DIAGNOSIS — M5412 Radiculopathy, cervical region: Secondary | ICD-10-CM

## 2023-03-07 NOTE — Telephone Encounter (Signed)
Dettinger NTBS in Oct for 6 mos FU RF sent to pharmacy

## 2023-03-07 NOTE — Telephone Encounter (Signed)
Appt scheduled for 04/15/2023

## 2023-04-01 ENCOUNTER — Other Ambulatory Visit: Payer: Self-pay | Admitting: Family Medicine

## 2023-04-01 DIAGNOSIS — I10 Essential (primary) hypertension: Secondary | ICD-10-CM

## 2023-04-15 ENCOUNTER — Ambulatory Visit: Payer: Managed Care, Other (non HMO) | Admitting: Family Medicine

## 2023-04-18 ENCOUNTER — Encounter: Payer: Self-pay | Admitting: Family Medicine

## 2023-05-23 ENCOUNTER — Ambulatory Visit (INDEPENDENT_AMBULATORY_CARE_PROVIDER_SITE_OTHER): Payer: Managed Care, Other (non HMO) | Admitting: Family Medicine

## 2023-05-23 VITALS — BP 113/73 | HR 60 | Ht 67.0 in | Wt 223.0 lb

## 2023-05-23 DIAGNOSIS — E559 Vitamin D deficiency, unspecified: Secondary | ICD-10-CM

## 2023-05-23 DIAGNOSIS — I10 Essential (primary) hypertension: Secondary | ICD-10-CM

## 2023-05-23 DIAGNOSIS — E782 Mixed hyperlipidemia: Secondary | ICD-10-CM

## 2023-05-23 DIAGNOSIS — N400 Enlarged prostate without lower urinary tract symptoms: Secondary | ICD-10-CM

## 2023-05-23 DIAGNOSIS — K219 Gastro-esophageal reflux disease without esophagitis: Secondary | ICD-10-CM | POA: Diagnosis not present

## 2023-05-23 DIAGNOSIS — N529 Male erectile dysfunction, unspecified: Secondary | ICD-10-CM

## 2023-05-23 DIAGNOSIS — G4489 Other headache syndrome: Secondary | ICD-10-CM

## 2023-05-23 MED ORDER — ICOSAPENT ETHYL 1 G PO CAPS
2.0000 g | ORAL_CAPSULE | Freq: Two times a day (BID) | ORAL | 3 refills | Status: DC
Start: 2023-05-23 — End: 2023-12-14

## 2023-05-23 MED ORDER — VITAMIN D (ERGOCALCIFEROL) 1.25 MG (50000 UNIT) PO CAPS: 50000.0000 [IU] | ORAL_CAPSULE | ORAL | 1 refills | Status: DC

## 2023-05-23 MED ORDER — PROPRANOLOL HCL 20 MG PO TABS: 20.0000 mg | ORAL_TABLET | Freq: Two times a day (BID) | ORAL | 3 refills | Status: DC

## 2023-05-23 MED ORDER — FAMOTIDINE 40 MG PO TABS
40.0000 mg | ORAL_TABLET | Freq: Two times a day (BID) | ORAL | 3 refills | Status: DC
Start: 2023-05-23 — End: 2023-12-14

## 2023-05-23 MED ORDER — SILDENAFIL CITRATE 20 MG PO TABS: 20.0000 mg | ORAL_TABLET | ORAL | 1 refills | Status: DC | PRN

## 2023-05-23 MED ORDER — ROSUVASTATIN CALCIUM 10 MG PO TABS: 10.0000 mg | ORAL_TABLET | Freq: Every day | ORAL | 3 refills | Status: DC

## 2023-05-23 MED ORDER — AMLODIPINE-OLMESARTAN 10-40 MG PO TABS: 1.0000 | ORAL_TABLET | Freq: Every day | ORAL | 3 refills | Status: DC

## 2023-05-23 NOTE — Progress Notes (Signed)
BP 113/73   Pulse 60   Ht 5\' 7"  (1.702 m)   Wt 223 lb (101.2 kg)   SpO2 98%   BMI 34.93 kg/m    Subjective:   Patient ID: Frederick Barr, male    DOB: 25-Nov-1965, 57 y.o.   MRN: [id number]  HPI: Frederick Barr is a 57 y.o. male presenting on 05/23/2023 for Hyperlipidemia, Medical Management of Chronic Issues, and Hypertension   HPI Hypertension Patient is currently on amlodipine-olmesartan and propranolol, and their blood pressure today is 113/73. Patient denies any lightheadedness or dizziness. Patient denies headaches, blurred vision, chest pains, shortness of breath, or weakness. Denies any side effects from medication and is content with current medication.   Hyperlipidemia Patient is coming in for recheck of his hyperlipidemia. The patient is currently taking Vascepa and Crestor. They deny any issues with myalgias or history of liver damage from it. They deny any focal numbness or weakness or chest pain.   BPH Patient is coming in for recheck on BPH Symptoms: None currently except erectile dysfunction occasionally Medication: None currently Last PSA: 1 year ago, looks good  GERD Patient is currently on Pepcid.  She denies any major symptoms or abdominal pain or belching or burping. She denies any blood in her stool or lightheadedness or dizziness.   Relevant past medical, surgical, family and social history reviewed and updated as indicated. Interim medical history since our last visit reviewed. Allergies and medications reviewed and updated.  Review of Systems  Constitutional:  Negative for chills and fever.  Eyes:  Negative for visual disturbance.  Respiratory:  Negative for shortness of breath and wheezing.   Cardiovascular:  Negative for chest pain and leg swelling.  Musculoskeletal:  Negative for back pain and gait problem.  Skin:  Negative for rash.  Neurological:  Negative for dizziness and light-headedness.  All other systems reviewed and are negative.   Per  HPI unless specifically indicated above   Allergies as of 05/23/2023   No Known Allergies      Medication List        Accurate as of May 23, 2023  2:28 PM. If you have any questions, ask your nurse or doctor.          amLODipine-olmesartan 10-40 MG tablet Commonly known as: AZOR Take 1 tablet by mouth daily.   baclofen 10 MG tablet Commonly known as: LIORESAL Take 1 tablet (10 mg total) by mouth 3 (three) times daily.   diclofenac 75 MG EC tablet Commonly known as: VOLTAREN TAKE 1 TABLET BY MOUTH TWICE A DAY   famotidine 40 MG tablet Commonly known as: PEPCID Take 1 tablet (40 mg total) by mouth 2 (two) times daily.   icosapent Ethyl 1 g capsule Commonly known as: Vascepa Take 2 capsules (2 g total) by mouth 2 (two) times daily.   propranolol 20 MG tablet Commonly known as: INDERAL Take 1 tablet (20 mg total) by mouth 2 (two) times daily.   rosuvastatin 10 MG tablet Commonly known as: CRESTOR Take 1 tablet (10 mg total) by mouth daily.   sildenafil 20 MG tablet Commonly known as: REVATIO Take 1-5 tablets (20-100 mg total) by mouth as needed. Started by: Elige Radon Octa Uplinger   Vitamin D (Ergocalciferol) 1.25 MG (50000 UNIT) Caps capsule Commonly known as: DRISDOL Take 1 capsule (50,000 Units total) by mouth every 7 (seven) days.         Objective:   BP 113/73   Pulse 60   Ht 5'  7" (1.702 m)   Wt 223 lb (101.2 kg)   SpO2 98%   BMI 34.93 kg/m   Wt Readings from Last 3 Encounters:  05/23/23 223 lb (101.2 kg)  11/04/22 218 lb (98.9 kg)  10/29/22 215 lb (97.5 kg)    Physical Exam Vitals and nursing note reviewed.  Constitutional:      General: He is not in acute distress.    Appearance: He is well-developed. He is not diaphoretic.  Eyes:     General: No scleral icterus.    Conjunctiva/sclera: Conjunctivae normal.  Neck:     Thyroid: No thyromegaly.  Cardiovascular:     Rate and Rhythm: Normal rate and regular rhythm.     Heart sounds:  Normal heart sounds. No murmur heard. Pulmonary:     Effort: Pulmonary effort is normal. No respiratory distress.     Breath sounds: Normal breath sounds. No wheezing.  Musculoskeletal:        General: No swelling. Normal range of motion.     Cervical back: Neck supple.  Lymphadenopathy:     Cervical: No cervical adenopathy.  Skin:    General: Skin is warm and dry.     Findings: No rash.  Neurological:     Mental Status: He is alert and oriented to person, place, and time.     Coordination: Coordination normal.  Psychiatric:        Behavior: Behavior normal.       Assessment & Plan:   Problem List Items Addressed This Visit       Cardiovascular and Mediastinum   Hypertension - Primary   Relevant Medications   amLODipine-olmesartan (AZOR) 10-40 MG tablet   icosapent Ethyl (VASCEPA) 1 g capsule   propranolol (INDERAL) 20 MG tablet   rosuvastatin (CRESTOR) 10 MG tablet   sildenafil (REVATIO) 20 MG tablet     Digestive   GERD (gastroesophageal reflux disease)   Relevant Medications   famotidine (PEPCID) 40 MG tablet     Genitourinary   Benign prostatic hyperplasia without lower urinary tract symptoms     Other   Hyperlipidemia   Relevant Medications   amLODipine-olmesartan (AZOR) 10-40 MG tablet   icosapent Ethyl (VASCEPA) 1 g capsule   propranolol (INDERAL) 20 MG tablet   rosuvastatin (CRESTOR) 10 MG tablet   sildenafil (REVATIO) 20 MG tablet   Vitamin D deficiency   Relevant Medications   Vitamin D, Ergocalciferol, (DRISDOL) 1.25 MG (50000 UNIT) CAPS capsule   Other Relevant Orders   VITAMIN D 25 Hydroxy (Vit-D Deficiency, Fractures)   Other Visit Diagnoses     Headache syndrome       Relevant Medications   propranolol (INDERAL) 20 MG tablet   Erectile dysfunction, unspecified erectile dysfunction type       Relevant Medications   sildenafil (REVATIO) 20 MG tablet     Continue current medicine, he is having some erectile dysfunction so we will do  sildenafil that he can use as needed.  Blood pressure and everything else looks good, no changes.  Follow up plan: Return in about 6 months (around 11/20/2023), or if symptoms worsen or fail to improve, for Physical exam.  Counseling provided for all of the vaccine components Orders Placed This Encounter  Procedures   VITAMIN D 25 Hydroxy (Vit-D Deficiency, Fractures)    Arville Care, MD [facility] 05/23/2023, 2:28 PM

## 2023-05-24 LAB — VITAMIN D 25 HYDROXY (VIT D DEFICIENCY, FRACTURES): Vit D, 25-Hydroxy: 33.7 ng/mL (ref 30.0–100.0)

## 2023-07-20 ENCOUNTER — Other Ambulatory Visit: Payer: Self-pay | Admitting: Family Medicine

## 2023-07-20 DIAGNOSIS — K219 Gastro-esophageal reflux disease without esophagitis: Secondary | ICD-10-CM

## 2023-08-21 ENCOUNTER — Other Ambulatory Visit: Payer: Self-pay

## 2023-08-21 ENCOUNTER — Ambulatory Visit
Admission: RE | Admit: 2023-08-21 | Discharge: 2023-08-21 | Disposition: A | Discharge status: Home / Self Care | Payer: Managed Care, Other (non HMO) | Source: Ambulatory Visit | Attending: Internal Medicine | Admitting: Internal Medicine

## 2023-08-21 VITALS — BP 132/93 | HR 60 | Temp 98.4°F | Resp 18

## 2023-08-21 DIAGNOSIS — J209 Acute bronchitis, unspecified: Secondary | ICD-10-CM

## 2023-08-21 MED ORDER — DOXYCYCLINE HYCLATE 100 MG PO CAPS: 100.0000 mg | ORAL_CAPSULE | Freq: Two times a day (BID) | ORAL | 0 refills | Status: AC

## 2023-08-21 MED ORDER — BENZONATATE 200 MG PO CAPS: 200.0000 mg | ORAL_CAPSULE | Freq: Three times a day (TID) | ORAL | 0 refills | Status: AC | PRN

## 2023-08-21 NOTE — Discharge Instructions (Addendum)
 A prescription was sent for Doxycyline. This is an antibiotic used to treat upper respiratory infections. Take as directed. I have also sent you a prescription for cough medicine as well.   Return in 3-4 days if no improvement. It is very important for you to pay attention to any new symptoms or worsening of your current condition. Please go directly to the Emergency Department immediately should you begin to have any of the following symptoms: shortness of breath, chest pain or difficulty breathing.

## 2023-08-21 NOTE — ED Provider Notes (Signed)
[facility]  Note:  This document was prepared using Dragon voice recognition software and may include unintentional dictation errors.  MRN: [id number] DOB: [birth year] DATE: 08/21/23   Subjective:  Chief Complaint:  Chief Complaint  Patient presents with   Nasal Congestion    Coughing, intermittent sore throat, runny nose - Entered by patient   Cough     HPI: Frederick Barr is a 58 y.o. male presenting for productive cough and congestion for over a week. Patient states he has had a persistent cough for over a week now. He feels that the cough is getting worse and is starting to become productive. He also reports nasal congestion and headache. He has been taking dayquil and sudafed with no improvement. He also reports intermittent sore throat, but none currently. No known sick contacts. He took a COVID test last night and was negative at that time. Denies EVAR, nausea/vomiting, abdominal pain, otalgia. Endorses productive cough, congestion, sore throat, headache. Presents NAD.  Prior to Admission medications   Medication Sig Start Date End Date Taking? Authorizing Provider  amLODipine -olmesartan  (AZOR ) 10-40 MG tablet Take 1 tablet by mouth daily. 05/23/23   Dettinger, Fonda LABOR, MD  baclofen  (LIORESAL ) 10 MG tablet Take 1 tablet (10 mg total) by mouth 3 (three) times daily. 10/11/22   Lavell Lye A, FNP  diclofenac  (VOLTAREN ) 75 MG EC tablet TAKE 1 TABLET BY MOUTH TWICE A DAY 03/07/23   Dettinger, Fonda LABOR, MD  famotidine  (PEPCID ) 40 MG tablet Take 1 tablet (40 mg total) by mouth 2 (two) times daily. 05/23/23   Dettinger, Fonda LABOR, MD  icosapent  Ethyl (VASCEPA ) 1 g capsule Take 2 capsules (2 g total) by mouth 2 (two) times daily. 05/23/23   Dettinger, Fonda LABOR, MD  propranolol  (INDERAL ) 20 MG tablet Take 1 tablet (20 mg total) by mouth 2 (two) times daily. 05/23/23   Dettinger, Fonda LABOR, MD  rosuvastatin  (CRESTOR ) 10 MG tablet Take 1 tablet (10 mg total) by mouth daily. 05/23/23    Dettinger, Fonda LABOR, MD  sildenafil  (REVATIO ) 20 MG tablet Take 1-5 tablets (20-100 mg total) by mouth as needed. 05/23/23   Dettinger, Fonda LABOR, MD  Vitamin D , Ergocalciferol , (DRISDOL ) 1.25 MG (50000 UNIT) CAPS capsule Take 1 capsule (50,000 Units total) by mouth every 7 (seven) days. 05/23/23   Dettinger, Fonda LABOR, MD     No Known Allergies  History:   Past Medical History:  Diagnosis Date   Headache    Hyperlipidemia    Hypertension    Seizures (HCC)    as a teenager/ last seizure at age 41     Past Surgical History:  Procedure Laterality Date   COLONOSCOPY  10/06/2017   Dr.Pyrtle   COLONOSCOPY  2015   HEMORRHOID SURGERY  2012   excised overlying skin on prolapsed internal hemorrhoids and evacuated a large number of clots.      POLYPECTOMY      Family History  Problem Relation Age of Onset   Heart disease Mother 89   Stroke Mother    Hypertension Mother    Stroke Father    Alcoholism Father    Stroke Brother 30   Cancer - Lung Maternal Grandmother    Colon cancer Neg Hx    Esophageal cancer Neg Hx    Rectal cancer Neg Hx    Stomach cancer Neg Hx    Colon polyps Neg Hx     Social History   Tobacco Use   Smoking status: Every Day  Current packs/day: 0.50    Average packs/day: 0.5 packs/day for 18.1 years (9.1 ttl pk-yrs)    Types: Cigarettes    Start date: 07/12/2005   Smokeless tobacco: Never   Tobacco comments:    smokes 2-3 cigarettes a day  Vaping Use   Vaping status: Former  Substance Use Topics   Alcohol use: Not Currently    Comment: quit 2004   Drug use: No    Review of Systems  Constitutional:  Negative for fever.  HENT:  Positive for congestion, rhinorrhea, sinus pressure and sore throat. Negative for ear pain.   Respiratory:  Positive for cough.   Gastrointestinal:  Negative for abdominal pain, nausea and vomiting.  Neurological:  Positive for headaches.     Objective:   Vitals: BP (!) 132/93 (BP Location: Right Arm)   Pulse 60    Temp 98.4 F (36.9 C) (Oral)   Resp 18   SpO2 95%   Physical Exam Constitutional:      General: He is not in acute distress.    Appearance: Normal appearance. He is well-developed and overweight. He is not ill-appearing or toxic-appearing.  HENT:     Head: Normocephalic and atraumatic.     Right Ear: Tympanic membrane and ear canal normal.     Left Ear: There is impacted cerumen.     Mouth/Throat:     Pharynx: Oropharynx is clear. Uvula midline. No pharyngeal swelling, oropharyngeal exudate or posterior oropharyngeal erythema.     Tonsils: No tonsillar exudate or tonsillar abscesses.  Cardiovascular:     Rate and Rhythm: Normal rate and regular rhythm.     Heart sounds: Normal heart sounds.  Pulmonary:     Effort: Pulmonary effort is normal.     Breath sounds: Normal breath sounds.     Comments: Clear to auscultation bilaterally  Abdominal:     General: Bowel sounds are normal.     Palpations: Abdomen is soft.     Tenderness: There is no abdominal tenderness.  Skin:    General: Skin is warm and dry.  Neurological:     General: No focal deficit present.     Mental Status: He is alert.  Psychiatric:        Mood and Affect: Mood and affect normal.     Results:  Labs: No results found for this or any previous visit (from the past 24 hours).  Radiology: No results found.   UC Course/Treatments:  Procedures: Procedures   Medications Ordered in UC: Medications - No data to display   Assessment and Plan :     ICD-10-CM   1. Acute bronchitis, unspecified organism  J20.9      Acute bronchitis, unspecified organism Afebrile, nontoxic-appearing, NAD. VSS. DDX includes but not limited to: COVID, flu, bronchitis, pneumonia, viral URI COVID and flu not ordered given length of symptoms. Given ongoing cough with recent worsening, Doxycycline  100mg  BID was prescribed. Benzonatate  200mg  TID PRN was prescribed for cough. Strict ED precautions were given and patient  verbalized understanding.  ED Discharge Orders          Ordered    doxycycline  (VIBRAMYCIN ) 100 MG capsule  2 times daily        08/21/23 1347    benzonatate  (TESSALON ) 200 MG capsule  3 times daily PRN        08/21/23 1347             PDMP not reviewed this encounter.     Cierrah Dace P, PA-C  08/21/23 1354  

## 2023-08-21 NOTE — ED Triage Notes (Signed)
 C/O productive cough, headache, nasal congestion, runny nose and sore throat. Symptoms x 8 days. Patient is taking sudafed and day Quil for symptoms without relief. Home covid test last night was negative.

## 2023-09-12 ENCOUNTER — Other Ambulatory Visit: Payer: Self-pay | Admitting: Student

## 2023-09-12 DIAGNOSIS — M5412 Radiculopathy, cervical region: Secondary | ICD-10-CM

## 2023-09-14 ENCOUNTER — Other Ambulatory Visit: Payer: Self-pay | Admitting: Student

## 2023-09-14 DIAGNOSIS — M5412 Radiculopathy, cervical region: Secondary | ICD-10-CM

## 2023-09-26 ENCOUNTER — Ambulatory Visit
Admission: RE | Admit: 2023-09-26 | Discharge: 2023-09-26 | Disposition: A | Discharge status: Home / Self Care | Source: Ambulatory Visit | Attending: Student | Admitting: Student

## 2023-09-26 DIAGNOSIS — M5412 Radiculopathy, cervical region: Secondary | ICD-10-CM

## 2023-11-21 ENCOUNTER — Encounter: Payer: Managed Care, Other (non HMO) | Admitting: Family Medicine

## 2023-12-14 ENCOUNTER — Ambulatory Visit: Admitting: Family Medicine

## 2023-12-14 ENCOUNTER — Other Ambulatory Visit: Payer: Self-pay | Admitting: Family Medicine

## 2023-12-14 ENCOUNTER — Encounter: Payer: Self-pay | Admitting: Family Medicine

## 2023-12-14 VITALS — BP 129/85 | HR 62 | Temp 97.2°F | Ht 67.0 in | Wt 215.0 lb

## 2023-12-14 DIAGNOSIS — Z0001 Encounter for general adult medical examination with abnormal findings: Secondary | ICD-10-CM | POA: Diagnosis not present

## 2023-12-14 DIAGNOSIS — K219 Gastro-esophageal reflux disease without esophagitis: Secondary | ICD-10-CM

## 2023-12-14 DIAGNOSIS — N400 Enlarged prostate without lower urinary tract symptoms: Secondary | ICD-10-CM

## 2023-12-14 DIAGNOSIS — M5412 Radiculopathy, cervical region: Secondary | ICD-10-CM | POA: Diagnosis not present

## 2023-12-14 DIAGNOSIS — N529 Male erectile dysfunction, unspecified: Secondary | ICD-10-CM

## 2023-12-14 DIAGNOSIS — E782 Mixed hyperlipidemia: Secondary | ICD-10-CM

## 2023-12-14 DIAGNOSIS — Z0000 Encounter for general adult medical examination without abnormal findings: Secondary | ICD-10-CM

## 2023-12-14 DIAGNOSIS — I10 Essential (primary) hypertension: Secondary | ICD-10-CM | POA: Diagnosis not present

## 2023-12-14 DIAGNOSIS — E559 Vitamin D deficiency, unspecified: Secondary | ICD-10-CM

## 2023-12-14 MED ORDER — AMLODIPINE-ATORVASTATIN 5-20 MG PO TABS: 1.0000 | ORAL_TABLET | Freq: Two times a day (BID) | ORAL | 3 refills | Status: DC

## 2023-12-14 MED ORDER — PROPRANOLOL HCL 20 MG PO TABS: 20.0000 mg | ORAL_TABLET | Freq: Two times a day (BID) | ORAL | 3 refills | Status: AC

## 2023-12-14 MED ORDER — ICOSAPENT ETHYL 1 G PO CAPS: 2.0000 g | ORAL_CAPSULE | Freq: Two times a day (BID) | ORAL | 3 refills | Status: DC

## 2023-12-14 MED ORDER — SILDENAFIL CITRATE 20 MG PO TABS
20.0000 mg | ORAL_TABLET | ORAL | 1 refills | Status: AC | PRN
Start: 2023-12-14 — End: ?

## 2023-12-14 MED ORDER — DICLOFENAC SODIUM 75 MG PO TBEC: 75.0000 mg | DELAYED_RELEASE_TABLET | Freq: Two times a day (BID) | ORAL | 1 refills | Status: DC

## 2023-12-14 MED ORDER — ROSUVASTATIN CALCIUM 10 MG PO TABS: 10.0000 mg | ORAL_TABLET | Freq: Every day | ORAL | 3 refills | Status: AC

## 2023-12-14 MED ORDER — LANSOPRAZOLE 30 MG PO CPDR: 30.0000 mg | DELAYED_RELEASE_CAPSULE | Freq: Every day | ORAL | 3 refills | Status: AC

## 2023-12-14 NOTE — Telephone Encounter (Signed)
 Please let him know that I sent in the previous dosing but you will have to cut it in half because it looks like his insurance will cover 2 pills a day

## 2023-12-14 NOTE — Telephone Encounter (Signed)
 amLODipine -atorvastatin (CADUET) 10-40 MG tablet        Changed from: amLODipine -atorvastatin (CADUET) 5-20 MG tablet   Pharmacy comment: Alternative Requested:INSURANCE WILL COVER MAX OF 1 DAILY; CONSIDER DOSE  CHANGE OR CALLING INSURANCE

## 2023-12-14 NOTE — Progress Notes (Signed)
 BP 129/85   Pulse 62   Temp (!) 97.2 F (36.2 C)   Ht 5\' 7"  (1.702 m)   Wt 215 lb (97.5 kg)   SpO2 97%   BMI 33.67 kg/m    Subjective:   Patient ID: Frederick Barr, male    DOB: October 10, 1965, 58 y.o.   MRN: [id number]  HPI: Frederick Barr is a 58 y.o. male presenting on 12/14/2023 for Medical Management of Chronic Issues (CPE) and Hypertension   HPI Physical exam Patient is coming in today for physical exam. Patient denies any chest pain, shortness of breath, headaches or vision issues, abdominal complaints, diarrhea, nausea, vomiting, or joint issues.   Hypertension Patient is currently on amlodipine -olmesartan  and propranolol , and their blood pressure today is 129/85. Patient denies any lightheadedness or dizziness. Patient denies headaches, blurred vision, chest pains, shortness of breath, or weakness. Denies any side effects from medication and is content with current medication.   Hyperlipidemia Patient is coming in for recheck of his hyperlipidemia. The patient is currently taking Crestor . They deny any issues with myalgias or history of liver damage from it. They deny any focal numbness or weakness or chest pain.   BPH and ED Patient is coming in for recheck on BPH Symptoms: None currently Medication: None currently, uses occasional sildenafil  for ED Last PSA: Year ago  Relevant past medical, surgical, family and social history reviewed and updated as indicated. Interim medical history since our last visit reviewed. Allergies and medications reviewed and updated.  Review of Systems  Constitutional:  Negative for chills and fever.  HENT:  Negative for ear pain and tinnitus.   Eyes:  Negative for pain.  Respiratory:  Negative for cough, shortness of breath and wheezing.   Cardiovascular:  Negative for chest pain, palpitations and leg swelling.  Gastrointestinal:  Negative for abdominal pain, blood in stool, constipation and diarrhea.  Genitourinary:  Negative for dysuria  and hematuria.  Musculoskeletal:  Negative for back pain and myalgias.  Skin:  Negative for rash.  Neurological:  Negative for dizziness, weakness and headaches.  Psychiatric/Behavioral:  Negative for suicidal ideas.     Per HPI unless specifically indicated above   Allergies as of 12/14/2023   No Known Allergies      Medication List        Accurate as of December 14, 2023 10:46 AM. If you have any questions, ask your nurse or doctor.          STOP taking these medications    amLODipine -olmesartan  10-40 MG tablet Commonly known as: AZOR  Stopped by: Lucio Sabin Nickalaus Crooke   famotidine  40 MG tablet Commonly known as: PEPCID  Stopped by: Lucio Sabin Eulene Pekar       TAKE these medications    amLODipine -atorvastatin 5-20 MG tablet Commonly known as: Caduet Take 1 tablet by mouth 2 (two) times daily. Started by: Lucio Sabin Kennard Fildes   baclofen  10 MG tablet Commonly known as: LIORESAL  Take 1 tablet (10 mg total) by mouth 3 (three) times daily.   benzonatate  200 MG capsule Commonly known as: TESSALON  Take 1 capsule (200 mg total) by mouth 3 (three) times daily as needed for cough.   diclofenac  75 MG EC tablet Commonly known as: VOLTAREN  Take 1 tablet (75 mg total) by mouth 2 (two) times daily.   icosapent  Ethyl 1 g capsule Commonly known as: Vascepa  Take 2 capsules (2 g total) by mouth 2 (two) times daily.   lansoprazole  30 MG capsule Commonly known as: PREVACID  Take 1 capsule (  30 mg total) by mouth daily at 12 noon. Started by: Lucio Sabin Kimiya Brunelle   propranolol  20 MG tablet Commonly known as: INDERAL  Take 1 tablet (20 mg total) by mouth 2 (two) times daily.   rosuvastatin  10 MG tablet Commonly known as: CRESTOR  Take 1 tablet (10 mg total) by mouth daily.   sildenafil  20 MG tablet Commonly known as: REVATIO  Take 1-5 tablets (20-100 mg total) by mouth as needed.   Vitamin D  (Ergocalciferol ) 1.25 MG (50000 UNIT) Caps capsule Commonly known as: DRISDOL  Take 1  capsule (50,000 Units total) by mouth every 7 (seven) days.         Objective:   BP 129/85   Pulse 62   Temp (!) 97.2 F (36.2 C)   Ht 5\' 7"  (1.702 m)   Wt 215 lb (97.5 kg)   SpO2 97%   BMI 33.67 kg/m   Wt Readings from Last 3 Encounters:  12/14/23 215 lb (97.5 kg)  05/23/23 223 lb (101.2 kg)  11/04/22 218 lb (98.9 kg)    Physical Exam Vitals and nursing note reviewed.  Constitutional:      General: He is not in acute distress.    Appearance: Normal appearance. He is well-developed. He is not diaphoretic.  HENT:     Right Ear: External ear normal.     Left Ear: External ear normal.     Nose: Nose normal.     Mouth/Throat:     Pharynx: No oropharyngeal exudate.  Eyes:     General: No scleral icterus.       Right eye: No discharge.     Conjunctiva/sclera: Conjunctivae normal.     Pupils: Pupils are equal, round, and reactive to light.  Neck:     Thyroid : No thyromegaly.  Cardiovascular:     Rate and Rhythm: Normal rate and regular rhythm.     Heart sounds: Normal heart sounds. No murmur heard. Pulmonary:     Effort: Pulmonary effort is normal. No respiratory distress.     Breath sounds: Normal breath sounds. No wheezing.  Abdominal:     General: Bowel sounds are normal. There is no distension.     Palpations: Abdomen is soft.     Tenderness: There is no abdominal tenderness. There is no guarding or rebound.  Genitourinary:    Prostate: Enlarged. Not tender and no nodules present.     Rectum: Normal.  Musculoskeletal:        General: Normal range of motion.     Cervical back: Neck supple.  Lymphadenopathy:     Cervical: No cervical adenopathy.  Skin:    General: Skin is warm and dry.     Findings: No rash.  Neurological:     Mental Status: He is alert and oriented to person, place, and time.     Coordination: Coordination normal.  Psychiatric:        Behavior: Behavior normal.       Assessment & Plan:   Problem List Items Addressed This Visit        Cardiovascular and Mediastinum   Hypertension   Relevant Medications   amLODipine -atorvastatin (CADUET) 5-20 MG tablet   sildenafil  (REVATIO ) 20 MG tablet   rosuvastatin  (CRESTOR ) 10 MG tablet   propranolol  (INDERAL ) 20 MG tablet   icosapent  Ethyl (VASCEPA ) 1 g capsule   Other Relevant Orders   CBC with Differential/Platelet   CMP14+EGFR     Digestive   GERD (gastroesophageal reflux disease)   Relevant Medications   lansoprazole  (PREVACID )  30 MG capsule   Other Relevant Orders   CBC with Differential/Platelet   CMP14+EGFR     Genitourinary   Benign prostatic hyperplasia without lower urinary tract symptoms   Relevant Orders   PSA, total and free     Other   Hyperlipidemia   Relevant Medications   amLODipine -atorvastatin (CADUET) 5-20 MG tablet   sildenafil  (REVATIO ) 20 MG tablet   rosuvastatin  (CRESTOR ) 10 MG tablet   propranolol  (INDERAL ) 20 MG tablet   icosapent  Ethyl (VASCEPA ) 1 g capsule   Other Relevant Orders   CBC with Differential/Platelet   CMP14+EGFR   Lipid panel   Vitamin D  deficiency   Relevant Orders   VITAMIN D  25 Hydroxy (Vit-D Deficiency, Fractures)   Other Visit Diagnoses       Physical exam    -  Primary     Cervical radiculopathy       Relevant Medications   diclofenac  (VOLTAREN ) 75 MG EC tablet     Erectile dysfunction, unspecified erectile dysfunction type       Relevant Medications   sildenafil  (REVATIO ) 20 MG tablet       Patient feels like his GERD does okay but taking the famotidine  twice a day sometimes it is harder and he would like to go back to trying the lansoprazole  once a day.  Occasional back pain and takes anti-inflammatory for it, wants refill.  Will change his blood pressure pill to twice a day because he is fine in the morning but then goes up in the afternoon and see if that is better for him.  He wants to try switching back to lansoprazole   Refilled ED medications and will check blood work today. Follow up  plan: Return in about 1 year (around 12/13/2024), or if symptoms worsen or fail to improve, for Physical exam.  Counseling provided for all of the vaccine components Orders Placed This Encounter  Procedures   CBC with Differential/Platelet   CMP14+EGFR   Lipid panel   VITAMIN D  25 Hydroxy (Vit-D Deficiency, Fractures)   PSA, total and free    Jolyne Needs, MD Vickie Grana [facility] 12/14/2023, 10:46 AM

## 2023-12-15 LAB — CBC WITH DIFFERENTIAL/PLATELET
Basophils Absolute: 0.1 10*3/uL (ref 0.0–0.2)
Basos: 1 %
EOS (ABSOLUTE): 0.2 10*3/uL (ref 0.0–0.4)
Eos: 2 %
Hematocrit: 49.4 % (ref 37.5–51.0)
Hemoglobin: 15.5 g/dL (ref 13.0–17.7)
Immature Grans (Abs): 0 10*3/uL (ref 0.0–0.1)
Immature Granulocytes: 0 %
Lymphocytes Absolute: 2.9 10*3/uL (ref 0.7–3.1)
Lymphs: 39 %
MCH: 26.9 pg (ref 26.6–33.0)
MCHC: 31.4 g/dL — ABNORMAL LOW (ref 31.5–35.7)
MCV: 86 fL (ref 79–97)
Monocytes Absolute: 0.5 10*3/uL (ref 0.1–0.9)
Monocytes: 7 %
Neutrophils Absolute: 3.8 10*3/uL (ref 1.4–7.0)
Neutrophils: 51 %
Platelets: 334 10*3/uL (ref 150–450)
RBC: 5.77 x10E6/uL (ref 4.14–5.80)
RDW: 13.2 % (ref 11.6–15.4)
WBC: 7.4 10*3/uL (ref 3.4–10.8)

## 2023-12-15 LAB — CMP14+EGFR
ALT: 20 IU/L (ref 0–44)
AST: 20 IU/L (ref 0–40)
Albumin: 4.8 g/dL (ref 3.8–4.9)
Alkaline Phosphatase: 90 IU/L (ref 44–121)
BUN/Creatinine Ratio: 13 (ref 9–20)
BUN: 12 mg/dL (ref 6–24)
Bilirubin Total: 0.4 mg/dL (ref 0.0–1.2)
CO2: 23 mmol/L (ref 20–29)
Calcium: 10 mg/dL (ref 8.7–10.2)
Chloride: 104 mmol/L (ref 96–106)
Creatinine, Ser: 0.89 mg/dL (ref 0.76–1.27)
Globulin, Total: 2.2 g/dL (ref 1.5–4.5)
Glucose: 103 mg/dL — ABNORMAL HIGH (ref 70–99)
Potassium: 4.7 mmol/L (ref 3.5–5.2)
Sodium: 142 mmol/L (ref 134–144)
Total Protein: 7 g/dL (ref 6.0–8.5)
eGFR: 99 mL/min/{1.73_m2} (ref >59)

## 2023-12-15 LAB — PSA, TOTAL AND FREE
PSA, Free Pct: 35 %
PSA, Free: 0.28 ng/mL
Prostate Specific Ag, Serum: 0.8 ng/mL (ref 0.0–4.0)

## 2023-12-15 LAB — LIPID PANEL
Chol/HDL Ratio: 3.8 ratio (ref 0.0–5.0)
Cholesterol, Total: 125 mg/dL (ref 100–199)
HDL: 33 mg/dL — ABNORMAL LOW (ref >39)
LDL Chol Calc (NIH): 60 mg/dL (ref 0–99)
Triglycerides: 192 mg/dL — ABNORMAL HIGH (ref 0–149)
VLDL Cholesterol Cal: 32 mg/dL (ref 5–40)

## 2023-12-15 LAB — VITAMIN D 25 HYDROXY (VIT D DEFICIENCY, FRACTURES): Vit D, 25-Hydroxy: 56.4 ng/mL (ref 30.0–100.0)

## 2023-12-15 MED ORDER — AMLODIPINE BESY-BENAZEPRIL HCL 10-40 MG PO CAPS: 1.0000 | ORAL_CAPSULE | Freq: Every day | ORAL | 3 refills | Status: AC

## 2023-12-21 ENCOUNTER — Ambulatory Visit: Payer: Self-pay | Admitting: Family Medicine

## 2024-02-07 ENCOUNTER — Other Ambulatory Visit: Payer: Self-pay | Admitting: Family Medicine

## 2024-02-07 DIAGNOSIS — M5412 Radiculopathy, cervical region: Secondary | ICD-10-CM

## 2024-02-14 ENCOUNTER — Telehealth: Payer: Self-pay | Admitting: Pharmacy Technician

## 2024-02-14 ENCOUNTER — Other Ambulatory Visit (HOSPITAL_COMMUNITY): Payer: Self-pay

## 2024-02-14 ENCOUNTER — Other Ambulatory Visit: Payer: Self-pay | Admitting: Family Medicine

## 2024-02-14 DIAGNOSIS — E782 Mixed hyperlipidemia: Secondary | ICD-10-CM

## 2024-02-14 NOTE — Telephone Encounter (Signed)
 Pharmacy Patient Advocate Encounter   Received notification from CoverMyMeds that prior authorization for Vascepa  1GM capsules is required/requested.   Insurance verification completed.   The patient is insured through [facility] .   Per test claim: PA required; PA submitted to above mentioned insurance via CoverMyMeds Key/confirmation #/EOC [id number] Status is pending

## 2024-02-15 ENCOUNTER — Telehealth: Payer: Self-pay | Admitting: Pharmacy Technician

## 2024-02-15 ENCOUNTER — Other Ambulatory Visit (HOSPITAL_COMMUNITY): Payer: Self-pay

## 2024-02-15 MED ORDER — OMEGA-3-ACID ETHYL ESTERS 1 G PO CAPS
2.0000 g | ORAL_CAPSULE | Freq: Two times a day (BID) | ORAL | 3 refills | Status: AC
Start: 2024-02-15 — End: ?

## 2024-02-15 NOTE — Telephone Encounter (Signed)
 Pharmacy Patient Advocate Encounter   Received notification from CoverMyMeds that prior authorization for Omega-3-acid  Ethyl Esters 1GM capsules is required/requested.   Insurance verification completed.   The patient is insured through [employer] [facility] . Key: [id number]   Per test claim: PA required; PA submitted to above mentioned insurance via Fax Key/confirmation #/EOC [id number] Status is pending

## 2024-02-15 NOTE — Telephone Encounter (Signed)
 PA faxed/submitted for Lovaza . New Encounter has been be created for follow up. For additional info see Pharmacy Prior Auth telephone encounter from 02/15/24.

## 2024-02-15 NOTE — Telephone Encounter (Signed)
 Pharmacy Patient Advocate Encounter  Received notification from [employer] [facility] that Prior Authorization for Vascepa  1GM capsules has been DENIED.  Full denial letter will be uploaded to the media tab. See denial reason below.    PA #/Case ID/Reference #: [id number]  **I see a Rx for Lovaza  has been called in. Is the Vascepa  being replaced by Lovaza ? **

## 2024-02-15 NOTE — Telephone Encounter (Signed)
 Looks like insurance company does not want to cover the Vascepa  so I sent Lovaza  for him

## 2024-02-15 NOTE — Telephone Encounter (Signed)
 Yes because of the lack of coverage, trying to replace the Vascepa  with the Lovaza 

## 2024-02-15 NOTE — Telephone Encounter (Signed)
 VASCEPA  1 g capsule        Changed from: icosapent  Ethyl (VASCEPA ) 1 g capsule    Pharmacy comment: Alternative Requested:PRIOR AUTH DENIED; PLEASE CONSIDER ALTERNATIVE OR CONSIDER APPEAL.

## 2024-02-16 ENCOUNTER — Other Ambulatory Visit (HOSPITAL_COMMUNITY): Payer: Self-pay

## 2024-02-16 NOTE — Telephone Encounter (Signed)
 Additional information has been requested from the patient's insurance in order to proceed with the prior authorization request. Requested information has been sent, or form has been filled out and faxed back to [phone]

## 2024-02-22 ENCOUNTER — Other Ambulatory Visit (HOSPITAL_COMMUNITY): Payer: Self-pay

## 2024-02-22 NOTE — Telephone Encounter (Signed)
 I sent in the alternative to Vascepa  which is Lovaza  and it does not look like his insurance wants to cover that either, the prior authorization was rejected.  He may have to go to over-the-counter omega-3's or fish oils

## 2024-02-22 NOTE — Telephone Encounter (Signed)
 Pt made aware. He still has some vascepa  and will continue until all gone. The advised pt to take at least 2000mg  of either fish oil or Omega 3 daily. Will verify with Dr. Maryanne if this is alright.

## 2024-02-22 NOTE — Telephone Encounter (Signed)
 Pharmacy Patient Advocate Encounter  Received notification from [employer] that Prior Authorization for Omega-3-acid  Ethyl Esters 1GM capsules  has been DENIED.  Full denial letter will be uploaded to the media tab. See denial reason below.   PA #/Case ID/Reference #: [id number] LM

## 2024-02-22 NOTE — Telephone Encounter (Signed)
 Yes he can start with that amount and see if it does well

## 2024-02-22 NOTE — Telephone Encounter (Signed)
 Pt is aware.

## 2024-03-05 ENCOUNTER — Other Ambulatory Visit: Payer: Self-pay | Admitting: Family Medicine

## 2024-03-05 DIAGNOSIS — E559 Vitamin D deficiency, unspecified: Secondary | ICD-10-CM

## 2024-05-30 ENCOUNTER — Other Ambulatory Visit: Payer: Self-pay | Admitting: Family Medicine

## 2024-05-30 DIAGNOSIS — E559 Vitamin D deficiency, unspecified: Secondary | ICD-10-CM

## 2024-06-22 ENCOUNTER — Other Ambulatory Visit: Payer: Self-pay | Admitting: Family Medicine

## 2024-06-22 DIAGNOSIS — K219 Gastro-esophageal reflux disease without esophagitis: Secondary | ICD-10-CM

## 2024-12-14 ENCOUNTER — Encounter: Payer: Self-pay | Admitting: Family Medicine
